# Patient Record
Sex: Female | Born: 1940 | Race: White | Hispanic: No | Marital: Married | Smoking: Never smoker
Health system: Southern US, Community
[De-identification: ages and names within clinical notes are randomized; demographics above are authoritative.]

## PROBLEM LIST (undated history)

## (undated) DIAGNOSIS — R4701 Aphasia: Secondary | ICD-10-CM

## (undated) DIAGNOSIS — R4789 Other speech disturbances: Secondary | ICD-10-CM

## (undated) DIAGNOSIS — Z860100 Personal history of colon polyps, unspecified: Secondary | ICD-10-CM

## (undated) DIAGNOSIS — N390 Urinary tract infection, site not specified: Secondary | ICD-10-CM

## (undated) DIAGNOSIS — F411 Generalized anxiety disorder: Secondary | ICD-10-CM

## (undated) HISTORY — DX: Personal history of colon polyps, unspecified: Z86.0100

## (undated) HISTORY — DX: Personal history of colonic polyps: Z86.010

## (undated) HISTORY — DX: Aphasia: R47.01

## (undated) HISTORY — DX: Urinary tract infection, site not specified: N39.0

## (undated) HISTORY — DX: Other speech disturbances: R47.89

## (undated) HISTORY — DX: Generalized anxiety disorder: F41.1

---

## 1998-02-19 ENCOUNTER — Other Ambulatory Visit: Admission: RE | Admit: 1998-02-19 | Discharge: 1998-02-19 | Payer: Self-pay | Admitting: Obstetrics and Gynecology

## 1999-07-28 ENCOUNTER — Other Ambulatory Visit: Admission: RE | Admit: 1999-07-28 | Discharge: 1999-07-28 | Payer: Self-pay | Admitting: Obstetrics and Gynecology

## 1999-08-09 ENCOUNTER — Encounter: Payer: Self-pay | Admitting: Obstetrics and Gynecology

## 1999-08-09 ENCOUNTER — Encounter: Admission: RE | Admit: 1999-08-09 | Discharge: 1999-08-09 | Payer: Self-pay | Admitting: Obstetrics and Gynecology

## 1999-08-26 ENCOUNTER — Ambulatory Visit (HOSPITAL_COMMUNITY): Admission: RE | Admit: 1999-08-26 | Discharge: 1999-08-26 | Payer: Self-pay | Admitting: General Surgery

## 2000-09-18 ENCOUNTER — Other Ambulatory Visit: Admission: RE | Admit: 2000-09-18 | Discharge: 2000-09-18 | Payer: Self-pay | Admitting: Obstetrics and Gynecology

## 2001-03-08 ENCOUNTER — Ambulatory Visit (HOSPITAL_COMMUNITY): Admission: RE | Admit: 2001-03-08 | Discharge: 2001-03-08 | Payer: Self-pay | Admitting: Gastroenterology

## 2001-12-11 ENCOUNTER — Other Ambulatory Visit: Admission: RE | Admit: 2001-12-11 | Discharge: 2001-12-11 | Payer: Self-pay | Admitting: Obstetrics and Gynecology

## 2002-11-18 ENCOUNTER — Encounter: Payer: Self-pay | Admitting: Gastroenterology

## 2002-11-18 ENCOUNTER — Encounter: Admission: RE | Admit: 2002-11-18 | Discharge: 2002-11-18 | Payer: Self-pay | Admitting: Gastroenterology

## 2003-04-29 ENCOUNTER — Encounter: Admission: RE | Admit: 2003-04-29 | Discharge: 2003-04-29 | Payer: Self-pay | Admitting: Obstetrics and Gynecology

## 2003-04-29 ENCOUNTER — Encounter: Payer: Self-pay | Admitting: Obstetrics and Gynecology

## 2004-01-26 ENCOUNTER — Other Ambulatory Visit: Admission: RE | Admit: 2004-01-26 | Discharge: 2004-01-26 | Payer: Self-pay | Admitting: Obstetrics and Gynecology

## 2004-05-03 ENCOUNTER — Encounter: Admission: RE | Admit: 2004-05-03 | Discharge: 2004-05-03 | Payer: Self-pay | Admitting: Obstetrics and Gynecology

## 2005-05-02 ENCOUNTER — Other Ambulatory Visit: Admission: RE | Admit: 2005-05-02 | Discharge: 2005-05-02 | Payer: Self-pay | Admitting: Obstetrics and Gynecology

## 2006-12-06 ENCOUNTER — Other Ambulatory Visit: Admission: RE | Admit: 2006-12-06 | Discharge: 2006-12-06 | Payer: Self-pay | Admitting: Obstetrics and Gynecology

## 2006-12-06 ENCOUNTER — Encounter (INDEPENDENT_AMBULATORY_CARE_PROVIDER_SITE_OTHER): Payer: Self-pay | Admitting: *Deleted

## 2006-12-21 ENCOUNTER — Ambulatory Visit: Payer: Self-pay | Admitting: Internal Medicine

## 2006-12-21 DIAGNOSIS — E7800 Pure hypercholesterolemia, unspecified: Secondary | ICD-10-CM | POA: Insufficient documentation

## 2006-12-21 DIAGNOSIS — E785 Hyperlipidemia, unspecified: Secondary | ICD-10-CM | POA: Insufficient documentation

## 2006-12-21 DIAGNOSIS — R609 Edema, unspecified: Secondary | ICD-10-CM | POA: Insufficient documentation

## 2006-12-21 HISTORY — DX: Pure hypercholesterolemia, unspecified: E78.00

## 2006-12-21 HISTORY — DX: Edema, unspecified: R60.9

## 2006-12-22 LAB — CONVERTED CEMR LAB
ALT: 13 units/L (ref 0–35)
AST: 16 units/L (ref 0–37)
Albumin: 4.7 g/dL (ref 3.5–5.2)
Alkaline Phosphatase: 78 units/L (ref 39–117)
BUN: 16 mg/dL (ref 6–23)
Bilirubin, Direct: <0.1 mg/dL (ref 0.0–0.3)
CO2: 24 meq/L (ref 19–32)
Calcium: 9.4 mg/dL (ref 8.4–10.5)
Chloride: 107 meq/L (ref 96–112)
Creatinine, Ser: 0.71 mg/dL (ref 0.40–1.20)
Glucose, Bld: 88 mg/dL (ref 70–99)
Potassium: 5.2 meq/L (ref 3.5–5.3)
Sodium: 143 meq/L (ref 135–145)
Total Bilirubin: 0.4 mg/dL (ref 0.3–1.2)
Total Protein: 8.1 g/dL (ref 6.0–8.3)

## 2006-12-26 ENCOUNTER — Encounter: Payer: Self-pay | Admitting: Internal Medicine

## 2007-01-06 ENCOUNTER — Encounter: Payer: Self-pay | Admitting: Internal Medicine

## 2008-03-31 ENCOUNTER — Other Ambulatory Visit: Admission: RE | Admit: 2008-03-31 | Discharge: 2008-03-31 | Payer: Self-pay | Admitting: Obstetrics and Gynecology

## 2010-08-07 ENCOUNTER — Encounter: Payer: Self-pay | Admitting: Obstetrics and Gynecology

## 2012-06-05 DIAGNOSIS — H01119 Allergic dermatitis of unspecified eye, unspecified eyelid: Secondary | ICD-10-CM | POA: Diagnosis not present

## 2013-01-24 DIAGNOSIS — L259 Unspecified contact dermatitis, unspecified cause: Secondary | ICD-10-CM | POA: Diagnosis not present

## 2013-01-24 DIAGNOSIS — L219 Seborrheic dermatitis, unspecified: Secondary | ICD-10-CM | POA: Diagnosis not present

## 2013-02-20 ENCOUNTER — Other Ambulatory Visit: Payer: Self-pay | Admitting: Neurosurgery

## 2013-02-20 DIAGNOSIS — M5416 Radiculopathy, lumbar region: Secondary | ICD-10-CM

## 2013-05-27 DIAGNOSIS — Z0000 Encounter for general adult medical examination without abnormal findings: Secondary | ICD-10-CM | POA: Diagnosis not present

## 2013-08-08 DIAGNOSIS — R509 Fever, unspecified: Secondary | ICD-10-CM | POA: Diagnosis not present

## 2013-08-08 DIAGNOSIS — J111 Influenza due to unidentified influenza virus with other respiratory manifestations: Secondary | ICD-10-CM | POA: Diagnosis not present

## 2013-08-15 DIAGNOSIS — R300 Dysuria: Secondary | ICD-10-CM | POA: Diagnosis not present

## 2013-08-15 DIAGNOSIS — N390 Urinary tract infection, site not specified: Secondary | ICD-10-CM | POA: Diagnosis not present

## 2013-08-22 ENCOUNTER — Other Ambulatory Visit: Payer: Self-pay | Admitting: Internal Medicine

## 2013-08-22 DIAGNOSIS — Z0000 Encounter for general adult medical examination without abnormal findings: Secondary | ICD-10-CM | POA: Diagnosis not present

## 2013-08-22 DIAGNOSIS — Z1231 Encounter for screening mammogram for malignant neoplasm of breast: Secondary | ICD-10-CM

## 2013-08-22 DIAGNOSIS — Z23 Encounter for immunization: Secondary | ICD-10-CM | POA: Diagnosis not present

## 2013-08-22 DIAGNOSIS — Z1331 Encounter for screening for depression: Secondary | ICD-10-CM | POA: Diagnosis not present

## 2013-09-05 ENCOUNTER — Ambulatory Visit
Admission: RE | Admit: 2013-09-05 | Discharge: 2013-09-05 | Disposition: A | Discharge status: Home / Self Care | Payer: Medicare Other | Source: Ambulatory Visit | Attending: Internal Medicine | Admitting: Internal Medicine

## 2013-09-05 DIAGNOSIS — Z1231 Encounter for screening mammogram for malignant neoplasm of breast: Secondary | ICD-10-CM | POA: Diagnosis not present

## 2013-10-03 DIAGNOSIS — D126 Benign neoplasm of colon, unspecified: Secondary | ICD-10-CM | POA: Diagnosis not present

## 2013-10-03 DIAGNOSIS — Z1211 Encounter for screening for malignant neoplasm of colon: Secondary | ICD-10-CM | POA: Diagnosis not present

## 2013-10-03 DIAGNOSIS — D128 Benign neoplasm of rectum: Secondary | ICD-10-CM | POA: Diagnosis not present

## 2013-10-03 DIAGNOSIS — K621 Rectal polyp: Secondary | ICD-10-CM | POA: Diagnosis not present

## 2013-10-03 DIAGNOSIS — K620 Anal polyp: Secondary | ICD-10-CM | POA: Diagnosis not present

## 2013-12-19 DIAGNOSIS — L570 Actinic keratosis: Secondary | ICD-10-CM | POA: Diagnosis not present

## 2013-12-19 DIAGNOSIS — L821 Other seborrheic keratosis: Secondary | ICD-10-CM | POA: Diagnosis not present

## 2014-01-07 DIAGNOSIS — H2589 Other age-related cataract: Secondary | ICD-10-CM | POA: Diagnosis not present

## 2014-01-27 DIAGNOSIS — L821 Other seborrheic keratosis: Secondary | ICD-10-CM | POA: Diagnosis not present

## 2014-01-27 DIAGNOSIS — L819 Disorder of pigmentation, unspecified: Secondary | ICD-10-CM | POA: Diagnosis not present

## 2014-01-27 DIAGNOSIS — D1801 Hemangioma of skin and subcutaneous tissue: Secondary | ICD-10-CM | POA: Diagnosis not present

## 2015-02-10 DIAGNOSIS — H43819 Vitreous degeneration, unspecified eye: Secondary | ICD-10-CM | POA: Diagnosis not present

## 2015-02-10 DIAGNOSIS — H25819 Combined forms of age-related cataract, unspecified eye: Secondary | ICD-10-CM | POA: Diagnosis not present

## 2015-02-10 DIAGNOSIS — H524 Presbyopia: Secondary | ICD-10-CM | POA: Diagnosis not present

## 2015-02-10 DIAGNOSIS — H5203 Hypermetropia, bilateral: Secondary | ICD-10-CM | POA: Diagnosis not present

## 2015-02-10 DIAGNOSIS — H04129 Dry eye syndrome of unspecified lacrimal gland: Secondary | ICD-10-CM | POA: Diagnosis not present

## 2015-02-10 DIAGNOSIS — H52221 Regular astigmatism, right eye: Secondary | ICD-10-CM | POA: Diagnosis not present

## 2015-04-14 DIAGNOSIS — N390 Urinary tract infection, site not specified: Secondary | ICD-10-CM | POA: Diagnosis not present

## 2017-07-04 ENCOUNTER — Other Ambulatory Visit: Payer: Self-pay | Admitting: Internal Medicine

## 2017-07-04 DIAGNOSIS — R4701 Aphasia: Secondary | ICD-10-CM

## 2017-07-12 ENCOUNTER — Ambulatory Visit
Admission: RE | Admit: 2017-07-12 | Discharge: 2017-07-12 | Disposition: A | Discharge status: Home / Self Care | Payer: Medicare Other | Source: Ambulatory Visit | Attending: Internal Medicine | Admitting: Internal Medicine

## 2017-07-12 DIAGNOSIS — R4701 Aphasia: Secondary | ICD-10-CM

## 2017-07-12 MED ORDER — GADOBENATE DIMEGLUMINE 529 MG/ML IV SOLN: 13.0000 mL | Freq: Once | INTRAVENOUS | Status: DC | PRN

## 2017-10-10 ENCOUNTER — Other Ambulatory Visit: Payer: Self-pay | Admitting: Internal Medicine

## 2017-10-10 DIAGNOSIS — Z1231 Encounter for screening mammogram for malignant neoplasm of breast: Secondary | ICD-10-CM

## 2017-10-30 ENCOUNTER — Encounter: Payer: Self-pay | Admitting: Radiology

## 2017-10-30 ENCOUNTER — Ambulatory Visit
Admission: RE | Admit: 2017-10-30 | Discharge: 2017-10-30 | Disposition: A | Discharge status: Home / Self Care | Payer: Medicare Other | Source: Ambulatory Visit | Attending: Internal Medicine | Admitting: Internal Medicine

## 2017-10-30 DIAGNOSIS — Z1231 Encounter for screening mammogram for malignant neoplasm of breast: Secondary | ICD-10-CM

## 2019-03-26 ENCOUNTER — Encounter: Payer: Self-pay | Admitting: Neurology

## 2019-04-22 NOTE — Progress Notes (Signed)
NEUROLOGY CONSULTATION NOTE  TYCEE BLAMER MRN: [id number] DOB: Jan 12, 1941  Referring provider: Lavone Orn, MD Primary care provider: Lavone Orn, MD  Reason for consult:  Speech abnormality  HISTORY OF PRESENT ILLNESS: Courtney Rangel is a 78 year old right-handed white female who presents with worsening speech abnormality.  History supplemented by referring provider note.  In 2018, she developed an episode of severe right sided facial pain radiating up the jaw to the temple lasting up to a few minutes.  No other associated focal symptoms.  Afterwards, she noticed speech difficulty.  It is not word-finding difficulty.  She knows what she wants to say but has trouble getting the words out.   She reports that it has gradually gotten worse.  She also has a dull persistent headache in her right temple.  No visual disturbance, facial droop, facial numbness. Slurred speech or unilateral numbness or weakness.  She reports no difficulty reading or understanding other people when they speak to her.  She sometimes notes difficulty writing because her hand may shake.  She reports some short-term memory difficulty such as misplacing items but nothing significant that affects her independence and daily living.  She reports history of anxiety.  MRI of brain from 07/12/2017 was personally reviewed and demonstrated scattered periventricular and subcortical T2 hyperintensities in the cerebral white matter bilaterally, consistent with chronic small vessel ischemic changes, but no acute intracranial abnormality.  Labs, such as sed rate, CRP, CBC, CMP and TSH, were unremarkable.    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  She has no difficulty with her job except she has to concentrate to talk with people.  She uses a Art therapist.  She reports history of migraines in young adulthood but not for many  years.    PAST MEDICAL HISTORY: History reviewed. No pertinent past medical history.  PAST SURGICAL HISTORY: History reviewed. No pertinent surgical history.  MEDICATIONS: Outpatient Encounter Medications as of 04/24/2019  Medication Sig   escitalopram (LEXAPRO) 10 MG tablet 1/2 TABLET ONCE A DAY FOR 3 DAYS AND THEN ONE TABLET ONCE A DAY ONCE A DAY ORALLY 30 DAY(S)   LORazepam (ATIVAN) 0.5 MG tablet 1 TABLETE TWICE A DAY AS NEEDED ORALLY   No facility-administered encounter medications on file as of 04/24/2019.     ALLERGIES: No Known Allergies  FAMILY HISTORY: Family History  Problem Relation Age of Onset   Heart attack Mother    Heart attack Brother     SOCIAL HISTORY: Social History   Socioeconomic History   Marital status: Married    Spouse name: Not on file   Number of children: Not on file   Years of education: Not on file   Highest education level: Not on file  Occupational History   Not on file  Social Needs   Financial resource strain: Not on file   Food insecurity    Worry: Not on file    Inability: Not on file   Transportation needs    Medical: Not on file    Non-medical: Not on file  Tobacco Use   Smoking status: Never Smoker   Smokeless tobacco: Never Used  Substance and Sexual Activity   Alcohol use: Not on file   Drug use: Not on file   Sexual activity: Not on file  Lifestyle   Physical activity    Days per week: Not on file  Minutes per session: Not on file   Stress: Not on file  Relationships   Social connections    Talks on phone: Not on file    Gets together: Not on file    Attends religious service: Not on file    Active member of club or organization: Not on file    Attends meetings of clubs or organizations: Not on file    Relationship status: Not on file   Intimate partner violence    Fear of current or ex partner: Not on file    Emotionally abused: Not on file    Physically abused: Not on file    Forced  sexual activity: Not on file  Other Topics Concern   Not on file  Social History Narrative   One level with husband   Right handed   Education - high school   Caffeine - coffee 3 cups/day   Exercise - pickleball     REVIEW OF SYSTEMS: Constitutional: No fevers, chills, or sweats, no generalized fatigue, change in appetite Eyes: No visual changes, double vision, eye pain Ear, nose and throat: No hearing loss, ear pain, nasal congestion, sore throat Cardiovascular: No chest pain, palpitations Respiratory:  No shortness of breath at rest or with exertion, wheezes GastrointestinaI: No nausea, vomiting, diarrhea, abdominal pain, fecal incontinence Genitourinary:  No dysuria, urinary retention or frequency Musculoskeletal:  No neck pain, back pain Integumentary: No rash, pruritus, skin lesions Neurological: as above Psychiatric: No depression, insomnia, anxiety Endocrine: No palpitations, fatigue, diaphoresis, mood swings, change in appetite, change in weight, increased thirst Hematologic/Lymphatic:  No purpura, petechiae. Allergic/Immunologic: no itchy/runny eyes, nasal congestion, recent allergic reactions, rashes  PHYSICAL EXAM: Blood pressure (!) 141/56, pulse 76, temperature 97.9 F (36.6 C), height 5\' 4"  (1.626 m), weight 158 lb (71.7 kg), SpO2 99 %. General: No acute distress.  Patient appears well-groomed.   Head:  Normocephalic/atraumatic Eyes:  fundi examined but not visualized Neck: supple, no paraspinal tenderness, full range of motion Back: No paraspinal tenderness Heart: regular rate and rhythm Lungs: Clear to auscultation bilaterally. Vascular: No carotid bruits. Neurological Exam: Mental status: alert and oriented to person, place, and time, delayed recall poor, remote memory intact, fund of knowledge intact, attention and concentration impaired, speech dysfluent but not dysarthric, Difficulty with naming.  Able to read and repeat.  Follows commands.  Difficulty with  Trail Making Test, copying a cube and drawing a clock. Montreal Cognitive Assessment  04/24/2019  Visuospatial/ Executive (0/5) 1  Naming (0/3) 1  Attention: Read list of digits (0/2) 1  Attention: Read list of letters (0/1) 1  Attention: Serial 7 subtraction starting at 100 (0/3) 0  Language: Repeat phrase (0/2) 2  Language : Fluency (0/1) 0  Abstraction (0/2) 1  Delayed Recall (0/5) 1  Orientation (0/6) 5  Total 13  Adjusted Score (based on education) 14   Cranial nerves: CN I: not tested CN II: pupils equal, round and reactive to light, visual fields intact CN III, IV, VI:  full range of motion, no nystagmus, no ptosis CN V: facial sensation intact CN VII: upper and lower face symmetric CN VIII: hearing intact CN IX, X: gag intact, uvula midline CN XI: sternocleidomastoid and trapezius muscles intact CN XII: tongue midline Bulk & Tone: normal, no fasciculations. Motor:  5/5 throughout  Sensation:  temperature and vibration sensation intact. Deep Tendon Reflexes:  2+ throughout, toes downgoing.   Finger to nose testing:  Without dysmetria.   Heel to shin:  Without  dysmetria.   Gait:  Normal station and stride.  Able to turn and tandem walk. Romberg negative.  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  In addition to language, she also demonstrates cognitive deficits in visuospatial/executive functioning and delayed recall.  She demonstrates reduced attention and concentration, which may be affected by underlying anxiety.  PLAN: 1.  I would like to repeat MRI of brain without contrast 2.  I would like to send her for neuropsychological testing 3.  Follow up afterwards.  Thank you for allowing me to take part in the care of this patient.  Metta Clines, DO  CC: Lavone Orn, MD

## 2019-04-24 ENCOUNTER — Encounter: Payer: Self-pay | Admitting: Neurology

## 2019-04-24 ENCOUNTER — Ambulatory Visit: Payer: Medicare Other | Admitting: Neurology

## 2019-04-24 ENCOUNTER — Other Ambulatory Visit: Payer: Self-pay

## 2019-04-24 VITALS — BP 141/56 | HR 76 | Temp 97.9°F | Ht 64.0 in | Wt 158.0 lb

## 2019-04-24 DIAGNOSIS — R4701 Aphasia: Secondary | ICD-10-CM | POA: Diagnosis not present

## 2019-04-24 NOTE — Patient Instructions (Signed)
You seem to have a language dysfunction, which I want to evaluate further: 1.  We will check MRI of brain without contrast 2.  I will refer you to see Dr. Melvyn Novas for neurocognitive testing 3.  Follow up with me afterwards.

## 2019-05-19 ENCOUNTER — Encounter: Payer: Medicare Other | Admitting: Psychology

## 2019-05-21 ENCOUNTER — Other Ambulatory Visit: Payer: Self-pay

## 2019-05-21 ENCOUNTER — Ambulatory Visit
Admission: RE | Admit: 2019-05-21 | Discharge: 2019-05-21 | Disposition: A | Discharge status: Home / Self Care | Payer: Medicare Other | Source: Ambulatory Visit | Attending: Neurology | Admitting: Neurology

## 2019-05-21 DIAGNOSIS — R4701 Aphasia: Secondary | ICD-10-CM

## 2019-05-22 ENCOUNTER — Telehealth: Payer: Self-pay

## 2019-05-22 NOTE — Telephone Encounter (Signed)
-----   Message from Pieter Partridge, DO sent at 05/22/2019  6:03 AM EST ----- MRI of brain is unremarkable.

## 2019-05-22 NOTE — Telephone Encounter (Signed)
Called spoke with patient she was informed of results. And understands

## 2019-05-28 ENCOUNTER — Encounter: Payer: Medicare Other | Admitting: Psychology

## 2019-06-11 ENCOUNTER — Other Ambulatory Visit: Payer: Self-pay

## 2019-06-11 ENCOUNTER — Ambulatory Visit: Payer: Medicare Other | Admitting: Cardiology

## 2019-06-11 ENCOUNTER — Encounter: Payer: Self-pay | Admitting: Cardiology

## 2019-06-11 VITALS — BP 142/82 | HR 74 | Ht 64.0 in | Wt 152.4 lb

## 2019-06-11 DIAGNOSIS — Z7189 Other specified counseling: Secondary | ICD-10-CM

## 2019-06-11 DIAGNOSIS — E7800 Pure hypercholesterolemia, unspecified: Secondary | ICD-10-CM | POA: Diagnosis not present

## 2019-06-11 DIAGNOSIS — R030 Elevated blood-pressure reading, without diagnosis of hypertension: Secondary | ICD-10-CM | POA: Diagnosis not present

## 2019-06-11 DIAGNOSIS — Z8249 Family history of ischemic heart disease and other diseases of the circulatory system: Secondary | ICD-10-CM | POA: Diagnosis not present

## 2019-06-11 NOTE — Patient Instructions (Signed)
Medication Instructions:  Your Physician recommend you continue on your current medication as directed.    *If you need a refill on your cardiac medications before your next appointment, please call your pharmacy*  Lab Work: None  Testing/Procedures: None  Follow-Up: At [facility], you and your health needs are our priority.  As part of our continuing mission to provide you with exceptional heart care, we have created designated Provider Care Teams.  These Care Teams include your primary Cardiologist (physician) and Advanced Practice Providers (APPs -  Physician Assistants and Nurse Practitioners) who all work together to provide you with the care you need, when you need it.  Your next appointment:   2 month(s)  The format for your next appointment:   Virtual Visit   Provider:   Buford Dresser, MD

## 2019-06-11 NOTE — Progress Notes (Signed)
Cardiology Office Note:    Date:  06/11/2019   ID:  Courtney Rangel, DOB 13-May-1941, MRN [id number]  PCP:  Lavone Orn, MD  Cardiologist:  Buford Dresser, MD  Referring MD: Lavone Orn, MD   CC: new patient visit to discuss CV risk.  History of Present Illness:    Courtney Rangel is a 78 y.o. female without prior cardiac history who is seen as a new consult at the request of Lavone Orn, MD for the evaluation and management of cardiovascular risk. She is the mother of Cecilio Asper in our practice.  Brings 05/22/19 lipid panel results. I unfortunately do not have other records. She endorses that she is here to discuss prevention/risk factors.  Tchol 233, HDL 57, TG 166, LDL 143, NHDL 176  Cardiovascular risk factors: Prior clinical ASCVD: none Comorbid conditions: denies hypertension, diabetes, chronic kidney disease: Just recently told she has high cholesterol Metabolic syndrome/Obesity: none, at her highest adult weight right now Chronic inflammatory conditions: none Tobacco use history: never Family history: mother had heart conditions, died age 74. Older brother died age 25 of massive MI. Father has had an MI but not severe. Mat gma had stroke and passed away. Another brother died recently of cancer. Prior cardiac testing and/or incidental findings on other testing (ie coronary calcium): none Exercise level: no limitations right now, does still work full time, walks all day at work. No intentional activity. Is going to work on increasing activity and see if she is limited (if so, she will call me). Current diet: has been eating more poorly than usual with the pandemic, trying to improve.   HTN: denies history. Feels stressed today. Thinks it has been normal on prior checks. Has a BP cuff at home, will start checking. Instructed on how to use. If SBP >140 she will call me.  Denies chest pain, shortness of breath at rest or with normal exertion. No PND, orthopnea, LE edema  or unexpected weight gain. No syncope or palpitations.  ROS: long history of restless leg/toe cramping, not exertional. 1-2 years ago, had sharp pain go up her back. Has this pain rarely, no clear triggers, no associated symptoms.   No past medical history on file.  No past surgical history on file.  Current Medications: Current Outpatient Medications on File Prior to Visit  Medication Sig  . atorvastatin (LIPITOR) 10 MG tablet Take 10 mg by mouth daily.  Marland Kitchen escitalopram (LEXAPRO) 10 MG tablet Take 10 mg by mouth daily.    No current facility-administered medications on file prior to visit.      Allergies:   Patient has no known allergies.   Social History   Tobacco Use  . Smoking status: Never Smoker  . Smokeless tobacco: Never Used  Substance Use Topics  . Alcohol use: Not on file  . Drug use: Not on file    Family History: family history includes Heart attack in her brother and mother.  ROS:   Please see the history of present illness.  Additional pertinent ROS: Constitutional: Negative for chills, fever, unintentional weight loss. Has started to feel sweats HENT: Negative for ear pain and hearing loss.   Eyes: Negative for loss of vision and eye pain.  Respiratory: Negative for cough, sputum, wheezing.   Cardiovascular: See HPI. Gastrointestinal: Negative for abdominal pain, and hematochezia. Notes occasional dark stools, not recent Genitourinary: Negative for dysuria and hematuria.  Musculoskeletal: Negative for falls and myalgias.  Skin: Negative for itching and rash.  Neurological:  Negative for focal weakness, focal sensory changes and loss of consciousness. Is concerned about memory loss. Endo/Heme/Allergies: Does not bruise/bleed easily.     EKGs/Labs/Other Studies Reviewed:    The following studies were reviewed today: None  EKG:  EKG is personally reviewed.  The ekg ordered today demonstrates NSR  Recent Labs: No results found for requested labs within  last 8760 hours.  Recent Lipid Panel No results found for: CHOL, TRIG, HDL, CHOLHDL, VLDL, LDLCALC, LDLDIRECT  Physical Exam:    VS:  BP (!) 142/82   Pulse 74   Ht 5\' 4"  (1.626 m)   Wt 152 lb 6.4 oz (69.1 kg)   SpO2 97%   BMI 26.16 kg/m     Wt Readings from Last 3 Encounters:  06/11/19 152 lb 6.4 oz (69.1 kg)  04/24/19 158 lb (71.7 kg)    GEN: Well nourished, well developed in no acute distress HEENT: Normal, moist mucous membranes NECK: No JVD CARDIAC: regular rhythm, normal S1 and S2, no rubs or gallops. No murmurs. VASCULAR: Radial and DP pulses 2+ bilaterally. No carotid bruits RESPIRATORY:  Clear to auscultation without rales, wheezing or rhonchi  ABDOMEN: Soft, non-tender, non-distended MUSCULOSKELETAL:  Ambulates independently SKIN: Warm and dry, no edema NEUROLOGIC:  Alert and oriented x 3. No focal neuro deficits noted. PSYCHIATRIC:  Normal affect    ASSESSMENT:    1. Hypercholesteremia   2. Cardiac risk counseling   3. Family history of heart disease   4. Counseling on health promotion and disease prevention   5. Elevated blood pressure reading    PLAN:    Cardiac risk counseling and prevention recommendations: We spent significant time on this today. She is understandably concerned given her family history. However, she is very healthy for her age and has not had any known cardiac issues.  We discussed risk factors at length. We did the online ASCVD calculator together, went through the components and how they affect the score.  We discussed pathology of cholesterol, how plaques form, that MI/CVA result commonly from acute plaque rupture and not gradual stenosis. Discussed mechanism of statin to both decrease plaque accumulation and stabilize plaque that is already present. Discussed role of aspirin, why it is no longer a first line recommendation.  Discussed lifestyle:  -recommend heart healthy/Mediterranean diet, with whole grains, fruits, vegetable, fish,  lean meats, nuts, and olive oil. Limit salt. -recommend moderate walking, 3-5 times/week for 30-50 minutes each session. Aim for at least 150 minutes.week. Goal should be pace of 3 miles/hours, or walking 1.5 miles in 30 minutes -recommend avoidance of tobacco products. Avoid excess alcohol. -ASCVD risk score: 23% (optimum, 15%)  She was recently started on atorvastatin for her hypercholesterolemia. Tolerating. Reviewed side effects (and discussed how they compare to placebo).  Her blood pressure is slightly elevated today, but she is nervous. She will check at home and let me know if it is consistently 0000000 systolic.  Plan for follow up: 2 mos, we will check on lipids and blood pressure at that time.  Medication Adjustments/Labs and Tests Ordered: Current medicines are reviewed at length with the patient today.  Concerns regarding medicines are outlined above.  Orders Placed This Encounter  Procedures  . EKG 12-Lead   No orders of the defined types were placed in this encounter.   Patient Instructions  Medication Instructions:  Your Physician recommend you continue on your current medication as directed.    *If you need a refill on your cardiac medications before your  next appointment, please call your pharmacy*  Lab Work: None  Testing/Procedures: None  Follow-Up: At [facility], you and your health needs are our priority.  As part of our continuing mission to provide you with exceptional heart care, we have created designated Provider Care Teams.  These Care Teams include your primary Cardiologist (physician) and Advanced Practice Providers (APPs -  Physician Assistants and Nurse Practitioners) who all work together to provide you with the care you need, when you need it.  Your next appointment:   2 month(s)  The format for your next appointment:   Virtual Visit   Provider:   Buford Dresser, MD     Signed, Buford Dresser, MD PhD 06/11/2019 7:15 PM     [place]

## 2019-06-17 ENCOUNTER — Encounter: Payer: Medicare Other | Admitting: Psychology

## 2019-06-30 ENCOUNTER — Encounter: Payer: Medicare Other | Admitting: Psychology

## 2019-07-25 ENCOUNTER — Encounter: Payer: Medicare Other | Admitting: Psychology

## 2019-08-13 ENCOUNTER — Telehealth (INDEPENDENT_AMBULATORY_CARE_PROVIDER_SITE_OTHER): Payer: Medicare Other | Admitting: Cardiology

## 2019-08-13 ENCOUNTER — Encounter: Payer: Self-pay | Admitting: Cardiology

## 2019-08-13 VITALS — Ht 60.0 in | Wt 140.0 lb

## 2019-08-13 DIAGNOSIS — Z8249 Family history of ischemic heart disease and other diseases of the circulatory system: Secondary | ICD-10-CM | POA: Diagnosis not present

## 2019-08-13 DIAGNOSIS — Z7189 Other specified counseling: Secondary | ICD-10-CM | POA: Diagnosis not present

## 2019-08-13 DIAGNOSIS — E785 Hyperlipidemia, unspecified: Secondary | ICD-10-CM | POA: Diagnosis not present

## 2019-08-13 NOTE — Progress Notes (Signed)
Virtual Visit via Telephone Note   This visit type was conducted due to national recommendations for restrictions regarding the COVID-19 Pandemic (e.g. social distancing) in an effort to limit this patient's exposure and mitigate transmission in our community.  Due to her co-morbid illnesses, this patient is at least at moderate risk for complications without adequate follow up.  This format is felt to be most appropriate for this patient at this time.  The patient did not have access to video technology/had technical difficulties with video requiring transitioning to audio format only (telephone).  All issues noted in this document were discussed and addressed.  No physical exam could be performed with this format.  Please refer to the patient's chart for her  consent to telehealth for [facility].   Date:  08/13/2019   ID:  Courtney Rangel, DOB 04/17/1941, MRN [id number]  Patient Location: Home Provider Location: Home  PCP:  Lavone Orn, MD  Cardiologist:  Buford Dresser, MD  Electrophysiologist:  None   Evaluation Performed:  Follow-Up Visit  Chief Complaint:  Follow up  History of Present Illness:    Courtney Rangel is a 79 y.o. female with hyperlipidemia who is followed up for cardiovascular risk. She is the mother of Cecilio Asper in our practice.  The patient does not have symptoms concerning for COVID-19 infection (fever, chills, cough, or new shortness of breath).   Doing very well. Tolerating atorvastatin, had recheck blood work and was told it was fine. I cannot see labs today. I will attempt to get these from Dr. Delene Ruffini office. She feels much more comfortable about her cardiovascular risk after our first meeting.   She is doing well, following safe practices re: the pandemic. Still working. Got first dose of Covid vaccine, scheduled to get second dose 2/9.  Denies chest pain, shortness of breath at rest or with normal exertion. No PND, orthopnea, LE edema or  unexpected weight gain. No syncope or palpitations.  Current Meds  Medication Sig  . atorvastatin (LIPITOR) 10 MG tablet Take 10 mg by mouth daily.  . Cyanocobalamin (B-12 PO) Take 5,000 mg by mouth daily.  Marland Kitchen escitalopram (LEXAPRO) 10 MG tablet Take 10 mg by mouth daily.      Allergies:   Patient has no known allergies.   Social History   Tobacco Use  . Smoking status: Never Smoker  . Smokeless tobacco: Never Used  Substance Use Topics  . Alcohol use: Not on file  . Drug use: Not on file     Family Hx: The patient's family history includes Heart attack in her brother and mother.  ROS:   Please see the history of present illness.    All other systems reviewed and are negative.   Prior CV studies:   The following studies were reviewed today: No CV studies  Labs/Other Tests and Data Reviewed:    EKG:  An ECG dated 06/11/19 was personally reviewed today and demonstrated:  NSR  Recent Labs: No results found for requested labs within last 8760 hours.   Recent Lipid Panel No results found for: CHOL, TRIG, HDL, CHOLHDL, LDLCALC, LDLDIRECT  Wt Readings from Last 3 Encounters:  08/13/19 140 lb (63.5 kg)  06/11/19 152 lb 6.4 oz (69.1 kg)  04/24/19 158 lb (71.7 kg)     Objective:    Vital Signs:  Ht 5' (1.524 m)   Wt 140 lb (63.5 kg)   BMI 27.34 kg/m    Speaking comfortably on the phone, no  audible wheezing In no acute distress Alert and oriented Normal affect Normal speech  ASSESSMENT & PLAN:    CV risk counseling and prevention: -recommend heart healthy/Mediterranean diet, with whole grains, fruits, vegetable, fish, lean meats, nuts, and olive oil. Limit salt. -recommend moderate walking, 3-5 times/week for 30-50 minutes each session. Aim for at least 150 minutes.week. Goal should be pace of 3 miles/hours, or walking 1.5 miles in 30 minutes -recommend avoidance of tobacco products. Avoid excess alcohol.  Hyperlipidemia: doing well on statin. Reports that she  had lab recheck, but I cannot see. I could not retrieve through [facility], will attempt to get through Dr. Delene Ruffini office.  COVID-19 Education: The signs and symptoms of COVID-19 were discussed with the patient and how to seek care for testing (follow up with PCP or arrange E-visit).  The importance of social distancing was discussed today.  Time:   Today, I have spent 7 minutes via phone with the patient with telehealth technology discussing the above problems.  Total time including chart review (including attempt to get labs through [facility]) and documentation 15 minutes  Patient Instructions  Medication Instructions:  Your physician recommends that you continue on your current medications as directed. Please refer to the Current Medication list given to you today.  *If you need a refill on your cardiac medications before your next appointment, please call your pharmacy*  Lab Work: NONE  Testing/Procedures: NONE  Follow-Up: At [facility], you and your health needs are our priority.  As part of our continuing mission to provide you with exceptional heart care, we have created designated Provider Care Teams.  These Care Teams include your primary Cardiologist (physician) and Advanced Practice Providers (APPs -  Physician Assistants and Nurse Practitioners) who all work together to provide you with the care you need, when you need it.  Your next appointment:   12 month(s) or sooner as if needed. You will receive a reminder letter in the mail two months in advance. If you don't receive a letter, please call our office to schedule the follow-up appointment.  The format for your next appointment:   Either In Person or Virtual  Provider:   Buford Dresser, MD      Signed, Buford Dresser, MD  08/13/2019 9:13 PM    [place]

## 2019-08-13 NOTE — Patient Instructions (Signed)
Medication Instructions:  Your physician recommends that you continue on your current medications as directed. Please refer to the Current Medication list given to you today.  *If you need a refill on your cardiac medications before your next appointment, please call your pharmacy*  Lab Work: NONE  Testing/Procedures: NONE  Follow-Up: At [facility], you and your health needs are our priority.  As part of our continuing mission to provide you with exceptional heart care, we have created designated Provider Care Teams.  These Care Teams include your primary Cardiologist (physician) and Advanced Practice Providers (APPs -  Physician Assistants and Nurse Practitioners) who all work together to provide you with the care you need, when you need it.  Your next appointment:   12 month(s) or sooner as if needed. You will receive a reminder letter in the mail two months in advance. If you don't receive a letter, please call our office to schedule the follow-up appointment.  The format for your next appointment:   Either In Person or Virtual  Provider:   Buford Dresser, MD

## 2019-08-22 ENCOUNTER — Ambulatory Visit: Payer: Medicare Other | Admitting: Neurology

## 2020-09-30 ENCOUNTER — Telehealth: Payer: Self-pay

## 2020-09-30 NOTE — Telephone Encounter (Signed)
Patient requested that we no longer call her for follow up visits, she feels that there is "no need to be seen by the doctor anymore". VB 09-30-2020

## 2020-11-02 ENCOUNTER — Other Ambulatory Visit: Payer: Self-pay | Admitting: Internal Medicine

## 2020-11-02 ENCOUNTER — Ambulatory Visit
Admission: RE | Admit: 2020-11-02 | Discharge: 2020-11-02 | Disposition: A | Discharge status: Home / Self Care | Payer: Medicare Other | Source: Ambulatory Visit | Attending: Internal Medicine | Admitting: Internal Medicine

## 2020-11-02 DIAGNOSIS — M25572 Pain in left ankle and joints of left foot: Secondary | ICD-10-CM

## 2021-01-06 ENCOUNTER — Encounter: Payer: Self-pay | Admitting: Psychology

## 2021-03-17 ENCOUNTER — Ambulatory Visit (INDEPENDENT_AMBULATORY_CARE_PROVIDER_SITE_OTHER): Payer: Medicare Other | Admitting: Psychology

## 2021-03-17 ENCOUNTER — Other Ambulatory Visit: Payer: Self-pay

## 2021-03-17 ENCOUNTER — Ambulatory Visit: Payer: Medicare Other | Admitting: Psychology

## 2021-03-17 ENCOUNTER — Encounter: Payer: Self-pay | Admitting: Psychology

## 2021-03-17 DIAGNOSIS — R4789 Other speech disturbances: Secondary | ICD-10-CM | POA: Insufficient documentation

## 2021-03-17 DIAGNOSIS — F0280 Dementia in other diseases classified elsewhere without behavioral disturbance: Secondary | ICD-10-CM

## 2021-03-17 DIAGNOSIS — G309 Alzheimer's disease, unspecified: Secondary | ICD-10-CM

## 2021-03-17 DIAGNOSIS — Z86010 Personal history of colonic polyps: Secondary | ICD-10-CM

## 2021-03-17 DIAGNOSIS — F411 Generalized anxiety disorder: Secondary | ICD-10-CM

## 2021-03-17 DIAGNOSIS — R4701 Aphasia: Secondary | ICD-10-CM | POA: Insufficient documentation

## 2021-03-17 DIAGNOSIS — N390 Urinary tract infection, site not specified: Secondary | ICD-10-CM | POA: Insufficient documentation

## 2021-03-17 DIAGNOSIS — R4189 Other symptoms and signs involving cognitive functions and awareness: Secondary | ICD-10-CM

## 2021-03-17 HISTORY — DX: Alzheimer's disease, unspecified: F02.80

## 2021-03-17 NOTE — Progress Notes (Signed)
NEUROPSYCHOLOGICAL EVALUATION [place]. [facility] Department of Neurology  Date of Evaluation: March 17, 2021  Reason for Referral:   Courtney Rangel is a 80 y.o. right-handed Caucasian female referred by  Lavone Orn, M.D. , to characterize her current cognitive functioning and assist with diagnostic clarity and treatment planning in the context of subjective cognitive decline and expressive language concerns.   Assessment and Plan:   Clinical Impression(s): Courtney Rangel pattern of performance is suggestive of profound impairment surrounding all aspects of learning and memory. There was also notable impairment exhibited across executive functioning, receptive language, expressive language, and visuospatial abilities. Performance variability was exhibited across processing speed and complex attention. Basic attention represented her lone strength. However, she also did perform adequately on a task assessing safety/judgment. Courtney Rangel denied all cognitive concerns or difficulties completing instrumental activities of daily living (ADLs) independently. However, I do not feel that she has accurate insight into the extent of current limitations and she did add that she is retiring over the next few weeks due to memory dysfunction. 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 However, she may be towards the milder end of this spectrum as her husband also did not report pronounced functional decline.   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 Across memory tasks, Courtney Rangel had trouble learning information and did not benefit from repeated learning trials. After a brief delay, she was fully amnestic across all memory tasks and made comments towards the psychometrist to suggest that she did not recall ever  being presented this information 15 minutes earlier. Finally, she performed very poorly across yes/no recognition trials. Taken together, this suggests a severe impairment surrounding memory storage, which is the hallmark characteristic of this disease process. Additional impairments surrounding confrontation naming, semantic fluency, visuospatial abilities, and executive functioning are also unfortunately quite consistent with expected patterns of disease progression. 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 While this condition often shares Alzheimer's disease pathology, her memory performances are more consistent with Alzheimer's disease as being the primary disease process. Behavioral characteristics are not consistent with Lewy body dementia or frontotemporal dementia. Prior neuroimaging in 2020 did reveal mildly advanced small vessel ischemic changes and there could be a small vascular contribution to her current presentation. Continued medical monitoring will be important moving forward.   Recommendations: It appears that Courtney Rangel was last seen by a neurologist Metta Clines, D.O.) back in 2020. I would recommend that she re-establish care with neurology and discuss memory-based medication intervention. It is important to highlight that these medications have been shown to slow functional decline in some individuals. No current treatment is able to stop or reverse cognitive decline in the face of a degenerative brain disease.   While performance across neurocognitive testing is not a strong predictor of an individual's safety operating a motor vehicle, I would recommend that she fully abstain from driving pending the completion of a formal driving evaluation due to significant and somewhat diffuse cognitive impairment. Should she or her husband/family wish to pursue a formalized driving evaluation, they  would be encouraged to contact [employer] in [place], [place] at [phone]. Another option would be through [facility]; however, the latter would likely require a referral from a medical doctor. [facility] can be  reached directly at [phone].   Should there be further progression of deficits over time, Courtney Rangel is unlikely to regain any independent living skills lost. Therefore, it is recommended that she remain as involved as possible in all aspects of household chores, finances, and medication management, with supervision to ensure adequate performance. She will likely benefit from the establishment and maintenance of a routine in order to maximize her functional abilities over time.  It will be important for Courtney Rangel to have another person with her when in situations where she may need to process information, weigh the pros and cons of different options, and make decisions, in order to ensure that she fully understands and recalls all information to be considered.  If not already done, Courtney Rangel and her family may want to discuss her wishes regarding durable power of attorney and medical decision making, so that she can have input into these choices. Additionally, they may wish to discuss future plans for caretaking and seek out community options for in home/residential care should they become necessary.  Courtney Rangel is encouraged to attend to lifestyle factors for brain health (e.g., regular physical exercise, good nutrition habits, regular participation in cognitively-stimulating activities, and general stress management techniques), which are likely to have benefits for both emotional adjustment and cognition. Optimal control of vascular risk factors (including safe cardiovascular exercise and adherence to dietary recommendations) is encouraged. Continued participation in activities which provide mental stimulation and social interaction is also recommended.    Important information to remember should be provided in written formats. This should be placed in a highly visible and commonly frequented area of her residence to try and help promote recall.  Review of Records:   Ms. Tanks was seen by [facility] Metta Clines, D.O.) most recently on 04/24/2019 for an evaluation of a worsening speech abnormality. Briefly, In 2018, she developed an episode of severe right sided facial pain radiating up the jaw to the temple lasting up to a few minutes. No other associated focal symptoms were reported. Afterwards, she noticed speech difficulty. Specifically, she reported knowing what she wants to say but has trouble getting the words out. She reported that this has gradually worsened over time. She also reported a dull persistent headache in her right temple. She denied visual disturbance, facial droop, facial numbness, slurred speech, or unilateral numbness or weakness. She reported no difficulty reading or understanding other people when they speak to her. Outside of expressive language, she also reported some short-term memory difficulty such as misplacing items. The latter was not said to affect her independence or daily living. Performance on a brief cognitive screening instrument (MOCA) was 14/30. It appears that she was referred for neuropsychological testing but I do not have record of her ever following through with this referral.   Most recently, Ms. Rougeau was seen by [facility] Lavone Orn, M.D.) on 01/05/2021 for follow-up. Dr. Laurann Montana noted a history of unspecified cognitive impairment dating back about 1.5 years prior. At that time, her husband reported that symptoms had been worsening, particularly concerns surrounding short-term memory. An example included Ms. Glick being told something and forgetting about the conversation by the next day. She did report that she had been forgetting the names of clients while at work. Ultimately, Ms.  Stacy was referred for a comprehensive neuropsychological evaluation to characterize her cognitive abilities and to assist with diagnostic clarity and treatment planning.   Brain MRI on 07/12/2017 revealed mildly advanced chronic microvascular ischemia but no  evidence for stroke or other intracranial abnormality. Brain MRI on 05/21/2019 was stable relative to her previous scan.   Past Medical History:  Diagnosis Date   Ankle edema 12/21/2006   Expressive aphasia    Generalized anxiety disorder    History of colon polyps    Hypercholesterolemia 12/21/2006   UTI (urinary tract infection)     No past surgical history on file.   Current Outpatient Medications:    atorvastatin (LIPITOR) 10 MG tablet, Take 10 mg by mouth daily., Disp: , Rfl:    Cyanocobalamin (B-12 PO), Take 5,000 mg by mouth daily., Disp: , Rfl:    escitalopram (LEXAPRO) 10 MG tablet, Take 10 mg by mouth daily. , Disp: , Rfl:   Clinical Interview:   The following information was obtained during a clinical interview with Ms. Maqueda and her husband prior to cognitive testing.  Cognitive Symptoms: Decreased short-term memory: Denied. Ms. Caputo denied striking short-term memory concerns during interview. She did acknowledge occasionally misplacing things, but minimized the severity of this issue. Her husband expressed far greater concerns, noting that she will often forget conversations which were had the previous day, as well as upcoming appointments or events. He also noted that she has trouble remembering the names of familiar individuals and will often ask him "who was that?" He noted that memory concerns have been present for at least the past year but have seemed far worse during the past 3-4 months.  Decreased long-term memory: Denied. Decreased attention/concentration: Denied. Reduced processing speed: Denied. She endorsed "maybe" having slowed processing speed but emphasized that if she does, she is "not bothered by  it."  Difficulties with executive functions: Denied. Difficulties with emotion regulation: Denied. However, her husband did report her seeming more irritable, agitated, and having a shorter fuse, particularly while interacting with him. He said that he seems to "set her off" more often than usual.  Difficulties with receptive language: Denied. Difficulties with word finding: Endorsed. She stated "I can't get words out like I'd like to." She alluded to some trouble with word production, as well as more traditional word finding concerns.  Decreased visuoperceptual ability: Denied.  Trajectory of deficits: Per medical records, Ms. Plagman reported experiencing a very sharp pain on the right side of her head back in 2018 which persisted for several minutes. During the current interview, she expressed a belief that something happened to her at that time which led to persisting cognitive difficulties. Her husband highlighted that all brain scans had not suggestive any stroke or other distinct event. However, she remained strong in this belief.   Difficulties completing ADLs: Denied. Her husband did have to remind her of a single time where she got lost while driving. However, this did not seem to be a regular occurrence or concern.   Additional Medical History: History of traumatic brain injury/concussion: Denied. History of stroke: Denied. History of seizure activity: Denied. History of known exposure to toxins: Denied. Symptoms of chronic pain: Denied. Experience of frequent headaches/migraines: Denied. Frequent instances of dizziness/vertigo: Denied.  Sensory changes: She wears glasses with benefit. Other sensory changes/difficulties (e.g., hearing, taste, smell) were denied.  Balance/coordination difficulties: Denied. She also denied any recent falls.  Other motor difficulties: Denied.  Sleep History: Estimated hours obtained each night: Variable. She reported getting around 3-4 hours on bad  nights and 8+ hours on good nights.  Difficulties falling asleep: Denied. Medications were said to be helpful in allowing her to fall asleep quickly. Difficulties staying asleep: Endorsed. She  reported commonly waking throughout the night, often for no known reason. She will then toss and turn and have trouble falling back asleep.  Feels rested and refreshed upon awakening: Variably depending on the quantity and quality of sleep obtained the night before.   History of snoring: Endorsed. History of waking up gasping for air: Denied. Witnessed breath cessation while asleep: Denied.  History of vivid dreaming: Denied. Excessive movement while asleep: Denied outside of tossing and turning behaviors mentioned above.  Instances of acting out her dreams: Denied.  Psychiatric/Behavioral Health History: Depression: She denied to her knowledge any prior formal diagnosis surrounding depression or another related mental health condition. She did acknowledge that her mood has been a bit "lower" lately. However, this was attributed to the fact that she will be retiring in the next few weeks and will no longer have regular contact with her co-workers. She denied previously working with a Administrator, [employer]. Current or remote suicidal ideation, intent, or plan was also denied.  Anxiety: Endorsed. She reported a history of generalized anxiety symptoms dating back several years at least. She stated that current medications are helpful (she appears to be taking Lexapro).  Mania: Denied. Trauma History: Denied. Visual/auditory hallucinations: Denied. Delusional thoughts: Denied.  Tobacco: Denied. Alcohol: She reported consuming 1-2 glasses of wine after she gets home from work on a near nightly basis. Her husband noted that on nights where she has a second drink, he feels that her temperament changes and she may exhibit more pronounced cognitive weaknesses. She denied a history of problematic alcohol abuse or  dependence.  Recreational drugs: Denied.  Family History: Problem Relation Age of Onset   Heart attack Mother    Heart attack Brother    Memory loss Brother    Dementia Maternal Aunt    This information was confirmed by Ms. Wachsmuth.  Academic/Vocational History: Highest level of educational attainment: 12 years. She graduated from high school and described herself as an average (B/C) student in academic settings. Math was noted as a likely relative weakness.  History of developmental delay: Denied. History of grade repetition: Denied. Enrollment in special education courses: Denied. History of LD/ADHD: Denied.  Employment: She currently works for a Teacher, adult education as an Web designer. She noted that she is in the process of retiring, which will likely occur in the next few weeks. She did acknowledge that trouble remembering client names did factor into her retirement decision to some extent.   Evaluation Results:   Behavioral Observations: Ms. Body was accompanied by her husband, arrived to her appointment on time, and was appropriately dressed and groomed. She appeared alert and oriented. Observed gait and station were within normal limits. Gross motor functioning appeared intact upon informal observation and no abnormal movements (e.g., tremors) were noted. Her affect was generally relaxed and positive. Spontaneous speech was fluent. Mild word finding difficulties were observed during interview. Thought processes were generally coherent, organized, and normal in content. Insight into her cognitive difficulties appeared poor in that she largely denied all cognitive concerns despite testing revealing diffuse significant impairment, especially surrounding learning and memory.   During testing, Ms. Quirindongo was pleasant. She required instruction repetition and clarification across a majority of presented tasks. Word finding difficulties were quite pronounced, especially  across expressive language tests. Sustained attention was appropriate. Task engagement was adequate and she persisted when challenged. Overall, Ms. Morosky was cooperative with the clinical interview and subsequent testing procedures.   Adequacy of Effort: The validity of neuropsychological testing is  limited by the extent to which the individual being tested may be assumed to have exerted adequate effort during testing. Ms. Alim expressed her intention to perform to the best of her abilities and exhibited adequate task engagement and persistence. Scores across stand-alone and embedded performance validity measures were variable. However, her single below expectation performance is most likely due to true cognitive impairment rather than poor engagement or attempts to perform poorly. As such, the results of the current evaluation are believed to be a valid representation of Ms. Berrey's current cognitive functioning.  Test Results: Ms. Furse was poorly oriented at the time of the current evaluation. She incorrectly stated her current age ("74"). While she was unsure of the city she resides in, she did eventually come across the correct answer. She was unable to state the current year ("19 something"), month, day of the week, time, or current location. When asked the reason for current testing, she responded with "They tell me I cannot remember."   Intellectual abilities based upon educational and vocational attainment were estimated to be in the average range. Premorbid abilities were estimated to be within the lower limits of the average range based upon a single-word reading test.   Processing speed was variable, ranging from the exceptionally low to above average normative ranges. Basic attention was average to exceptionally high. More complex attention (e.g., working memory) was well below average to below average. Executive functioning was exceptionally low. She did perform in the average range  across a task assessing safety and judgment.   Assessed receptive language abilities were exceptionally low. Greatest areas of difficulty across this task included sequencing commands, understanding complex sentence structure, and following multi-step commands. Assessed expressive language was below average across a task assessing phonemic fluency but exceptionally low across all other assessments. Confrontation naming impairment was notable for a mix of true word finding issues with intact comprehension, as well as other instances where she did not appear to recognize the object in front of her.     Assessed visuospatial/visuoconstructional abilities were exceptionally low to well below average. Points were lost on her drawing of a clock due to her being confused and ultimately unable to make an attempt drawing clock hands. Points were lost on her copy of a complex figure due to mild visual distortions, as well as her omitting two internal aspects altogether.    Learning (i.e., encoding) of novel verbal information was exceptionally low. Spontaneous delayed recall (i.e., retrieval) of previously learned information was also exceptionally low. Retention rates were 0% across a story learning task, 0% across a list learning task, and 0% across a figure drawing task. Performance across recognition tasks was also exceptionally low, suggesting no evidence for adequate information consolidation.   Results of emotional screening instruments suggested that recent symptoms of generalized anxiety were in the minimal range, while symptoms of depression were within normal limits. A screening instrument assessing recent sleep quality suggested the presence of minimal sleep dysfunction.  Tables of Scores:   Note: This summary of test scores accompanies the interpretive report and should not be considered in isolation without reference to the appropriate sections in the text. Descriptors are based on appropriate  normative data and may be adjusted based on clinical judgment. Terms such as "Within Normal Limits" and "Outside Normal Limits" are used when a more specific description of the test score cannot be determined.       Percentile - Normative Descriptor > 98 - Exceptionally High 91-97 - Well Above  Average 75-90 - Above Average 25-74 - Average 9-24 - Below Average 2-8 - Well Below Average < 2 - Exceptionally Low       Validity:   DESCRIPTOR       Dot Counting Test: --- --- Within Normal Limits  RBANS Effort Index: --- --- Outside Normal Limits  WAIS-IV Reliable Digit Span: --- --- Within Normal Limits       Orientation:      Raw Score Percentile   NAB Orientation, Form 1 20/29 --- ---       Cognitive Screening:      Raw Score Percentile   SLUMS: 8/30 --- ---       RBANS, Form A: Standard Score/ Scaled Score Percentile   Total Score 51 <1 Exceptionally Low  Immediate Memory 53 <1 Exceptionally Low    List Learning 3 1 Exceptionally Low    Story Memory 2 <1 Exceptionally Low  Visuospatial/Constructional 62 1 Exceptionally Low    Figure Copy 4 2 Well Below Average    Line Orientation 7/20 <2 Exceptionally Low  Language 40 <1 Exceptionally Low    Picture Naming 2/10 <2 Exceptionally Low    Semantic Fluency 1 <1 Exceptionally Low  Attention 97 42 Average    Digit Span 16 98 Exceptionally High    Coding 3 1 Exceptionally Low  Delayed Memory 40 <1 Exceptionally Low    List Recall 0/10 <2 Exceptionally Low    List Recognition 9/20 <2 Exceptionally Low    Story Recall 1 <1 Exceptionally Low    Story Recognition 4/12 <1 Exceptionally Low    Figure Recall 1 <1 Exceptionally Low    Figure Recognition 0/8 1 Exceptionally Low       Intellectual Functioning:      Standard Score Percentile   Test of Premorbid Functioning: 91 27 Average       Attention/Executive Function:     Trail Making Test (TMT): Raw Score (Scaled Score) Percentile     Part A 42 secs.,  0 errors (10) 50 Average     Part B Discontinued --- Impaired  *Based on [employer] Older Normative Studies (MOANS)           Scaled Score Percentile   WAIS-IV Digit Span: 6 9 Below Average    Forward 9 37 Average    Backward 5 5 Well Below Average    Sequencing 6 9 Below Average        Scaled Score Percentile   WAIS-IV Similarities: 1 <1 Exceptionally Low       D-KEFS Color-Word Interference Test: Raw Score (Scaled Score) Percentile     Color Naming 58 secs. (1) <1 Exceptionally Low    Word Reading 22 secs. (12) 75 Above Average    Inhibition 113 secs. (4) 2 Well Below Average      Total Errors 20 errors (1) <1 Exceptionally Low    Inhibition/Switching Discontinued --- Impaired      Total Errors --- --- ---       NAB Executive Functions Module, Form 1: T Score Percentile     Judgment 51 54 Average       Language:      Raw Score Percentile   PPVT Screening Instrument: 12/16 --- Outside Normal Limits  Sentence Repetition: 12/22 3 Well Below Average       Verbal Fluency Test: Raw Score (Scaled Score) Percentile     Phonemic Fluency (CFL) 21 (6) 9 Below Average    Category Fluency 7 (2) <1  Exceptionally Low  *Based on [employer] Older Normative Studies (MOANS)          NAB Language Module, Form 1: Standard Score/ T Score Percentile   Total Score 59 <1 Exceptionally Low    Oral Production 31 3 Well Below Average    Auditory Comprehension 25 1 Exceptionally Low    Naming 6/31 (19) <1 Exceptionally Low    Writing 26 1 Exceptionally Low    Bill Payment 33 5 Well Below Average       Visuospatial/Visuoconstruction:      Raw Score Percentile   Clock Drawing: 6/10 --- Impaired       Mood and Personality:      Raw Score Percentile   Geriatric Depression Scale: 8 --- Within Normal Limits  PROMIS Anxiety Questionnaire: 12 --- None to Slight       Additional Questionnaires:      Raw Score Percentile   PROMIS Sleep Disturbance Questionnaire: 24 --- None to Slight   Informed Consent and Coding/Compliance:    The current evaluation represents a clinical evaluation for the purposes previously outlined by the referral source and is in no way reflective of a forensic evaluation.   Ms. Tonche was provided with a verbal description of the nature and purpose of the present neuropsychological evaluation. Also reviewed were the foreseeable risks and/or discomforts and benefits of the procedure, limits of confidentiality, and mandatory reporting requirements of this provider. The patient was given the opportunity to ask questions and receive answers about the evaluation. Oral consent to participate was provided by the patient.   This evaluation was conducted by Christia Reading, Ph.D., licensed clinical neuropsychologist. Ms. Tim completed a clinical interview with Dr. Melvyn Novas, billed as one unit [phone], and 145 minutes of cognitive testing and scoring, billed as one unit [phone] and four additional units [id number]. Psychometrist Milana Kidney, B.S., assisted Dr. Melvyn Novas with test administration and scoring procedures. As a separate and discrete service, Dr. Melvyn Novas spent a total of 160 minutes in interpretation and report writing billed as one unit [phone] and two units [id number].

## 2021-03-17 NOTE — Progress Notes (Signed)
   Psychometrician Note   Cognitive testing was administered to Courtney Rangel by Milana Kidney, B.S. (psychometrist) under the supervision of Dr. Christia Reading, Ph.D., licensed psychologist on 03/17/21. Courtney Rangel did not appear overtly distressed by the testing session per behavioral observation or responses across self-report questionnaires. Rest breaks were offered.    The battery of tests administered was selected by Dr. Christia Reading, Ph.D. with consideration to Courtney Rangel's current level of functioning, the nature of her symptoms, emotional and behavioral responses during interview, level of literacy, observed level of motivation/effort, and the nature of the referral question. This battery was communicated to the psychometrist. Communication between Dr. Christia Reading, Ph.D. and the psychometrist was ongoing throughout the evaluation and Dr. Christia Reading, Ph.D. was immediately accessible at all times. Dr. Christia Reading, Ph.D. provided supervision to the psychometrist on the date of this service to the extent necessary to assure the quality of all services provided.    Courtney Rangel will return within approximately 1-2 weeks for an interactive feedback session with Dr. Melvyn Novas at which time her test performances, clinical impressions, and treatment recommendations will be reviewed in detail. Courtney Rangel understands she can contact our office should she require our assistance before this time.  A total of 145 minutes of billable time were spent face-to-face with Courtney Rangel by the psychometrist. This includes both test administration and scoring time. Billing for these services is reflected in the clinical report generated by Dr. Christia Reading, Ph.D.  This note reflects time spent with the psychometrician and does not include test scores or any clinical interpretations made by Dr. Melvyn Novas. The full report will follow in a separate note.

## 2021-03-18 ENCOUNTER — Encounter: Payer: Self-pay | Admitting: Psychology

## 2021-03-28 ENCOUNTER — Other Ambulatory Visit: Payer: Self-pay

## 2021-03-28 ENCOUNTER — Ambulatory Visit (INDEPENDENT_AMBULATORY_CARE_PROVIDER_SITE_OTHER): Payer: Medicare Other | Admitting: Psychology

## 2021-03-28 DIAGNOSIS — F0280 Dementia in other diseases classified elsewhere without behavioral disturbance: Secondary | ICD-10-CM

## 2021-03-28 DIAGNOSIS — G309 Alzheimer's disease, unspecified: Secondary | ICD-10-CM | POA: Diagnosis not present

## 2021-03-28 NOTE — Progress Notes (Addendum)
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  If no side effects in 4 weeks, then would start memantine titrating to '10mg'$  twice daily Advised no driving until she undergoes a formal driving evaluation conducted by OT.  Contacts provided. Recommended mental activities, socialization, exercise Advised to establish a POA Provided resources regarding alzheimer's support groups and assistance Follow up in 6 months with Etheleen Mayhew, PA-C in the memory clinic.  04/05/2021 ADDENDUM:  Patient underwent a behind the wheel driving evaluation on 03/31/2021 - at that time, she was found to safely operate a motor vehicle without restrictions. Metta Clines  Subjective:  Makynlie Franca is a 80 year old right-handed white female who follows up for worsening speech.  She is accompanied by her husband in the office and her daughters on phone.    UPDATE: Last seen in initial consultation in October 2020.  MRI of brain without contrast on 05/21/2019 personally reviewed was unremarkable.  Neuropsychological testing was ordered.  She was stubborn and held off on getting the testing.  She finally underwent neuropsychological evaluation on 03/17/2021 which was consistent with Alzheimer's dementia.  She still drives and husband says she seems okay driving.  She is retiring in 2 weeks.  Does not cook - trouble remembering recipes.  HISTORY: In 2018, she developed an episode of severe right sided facial pain radiating up the jaw to the temple lasting up to a few minutes.  No other associated focal symptoms.  Afterwards, she noticed speech difficulty.  It is not word-finding difficulty.  She knows what she wants to say but has trouble getting the words out.   She reports that it has gradually gotten worse.  She also has a dull persistent headache in her right temple.  No visual disturbance,  facial droop, facial numbness. Slurred speech or unilateral numbness or weakness.  She reports no difficulty reading or understanding other people when they speak to her.  She sometimes notes difficulty writing because her hand may shake.  She reports some short-term memory difficulty such as misplacing items but nothing significant that affects her independence and daily living.  She reports history of anxiety.   MRI of brain from 07/12/2017 demonstrated scattered periventricular and subcortical T2 hyperintensities in the cerebral white matter bilaterally, consistent with chronic small vessel ischemic changes, but no acute intracranial abnormality.  Labs, such as sed rate, CRP, CBC, CMP and TSH, were unremarkable.     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  She has no difficulty with her job except she has to concentrate to talk with people.  She uses a Art therapist.   She reports history of migraines in young adulthood but not for many years.    PAST MEDICAL HISTORY: Past Medical History:  Diagnosis Date   Ankle edema 12/21/2006   Generalized anxiety disorder    History of colon polyps    Hypercholesterolemia 12/21/2006   Major neurocognitive disorder due to Alzheimer's disease 03/17/2021   UTI (urinary tract infection)    Word finding difficulty     MEDICATIONS: Current Outpatient Medications on File Prior to Visit  Medication Sig Dispense Refill   atorvastatin (LIPITOR) 10 MG tablet Take 10 mg by mouth daily.     Cyanocobalamin (B-12 PO) Take 5,000 mg by mouth daily.  escitalopram (LEXAPRO) 10 MG tablet Take 10 mg by mouth daily.      No current facility-administered medications on file prior to visit.    ALLERGIES: No Known Allergies  FAMILY HISTORY: Family History  Problem Relation Age of Onset   Heart attack Mother    Heart attack Brother    Memory loss Brother     Dementia Maternal Aunt       Objective:  Blood pressure 116/62, pulse 82, height '5\' 4"'$  (1.626 m), weight 152 lb 9.6 oz (69.2 kg), SpO2 96 %. General: No acute distress.  Patient appears well-groomed.   Head:  Normocephalic/atraumatic Eyes:  Fundi examined but not visualized Neck: supple, no paraspinal tenderness, full range of motion Heart:  Regular rate and rhythm Lungs:  Clear to auscultation bilaterally Back: No paraspinal tenderness Neurological Exam: alert and oriented to person, place, and time.  Speech fluent and not dysarthric, language intact.   [employer] Mental Exam 03/29/2021  Weekday Correct 1  Current year 1  What state are we in? 1  Amount spent 0  Amount left 0  # of Animals 0  5 objects recall 0  Number series 1  Hour markers 2  Time correct 2  Placed X in triangle correctly 1  Largest Figure 1  Name of female 2  Date back to work 0  Type of work 0  State she lived in 0  Total score 12   CN II-XII intact. Bulk and tone normal, muscle strength 5/5 throughout.  Sensation to light touch intact.  Deep tendon reflexes 2+ throughout  Finger to nose testing intact.  Gait normal, Romberg negative.   Metta Clines, DO  CC: Lavone Orn, MD

## 2021-03-28 NOTE — Progress Notes (Signed)
   Neuropsychology Feedback Session Courtney Rangel. [place] [facility] Neu[facility]  Reason for Referral:   Courtney Rangel is a 80 y.o. right-handed Caucasian female referred by  Lavone Orn, M.D. , to characterize her current cognitive functioning and assist with diagnostic clarity and treatment planning in the context of subjective cognitive decline and expressive language concerns.   Feedback:   [name] completed a comprehensive neuropsychological evaluation on 03/17/2021. Please refer to that encounter for the full report and recommendations. Briefly, results suggested profound impairment surrounding all aspects of learning and memory. There was also notable impairment exhibited across executive functioning, receptive language, expressive language, and visuospatial abilities. Performance variability was exhibited across processing speed and complex attention. Basic attention represented her lone strength. 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 Across memory tasks, [name] had trouble learning information and did not benefit from repeated learning trials. After a brief delay, she was fully amnestic across all memory tasks and made comments towards the psychometrist to suggest that she did not recall ever being presented this information 15 minutes earlier. Finally, she performed very poorly across yes/no recognition trials. Taken together, this suggests a severe impairment surrounding memory storage, which is the hallmark characteristic of this disease process. Additional impairments surrounding confrontation naming, semantic fluency, visuospatial abilities, and executive functioning are also unfortunately quite consistent with expected patterns of disease progression.  Ms. Orders was accompanied by her husband in person and her daughters via speakerphone. Content of the current session focused on the results of her  neuropsychological evaluation. [name] and her family were given the opportunity to ask questions and their questions were answered. They were encouraged to reach out should additional questions arise. A copy of her report was provided at the conclusion of the visit.      40 minutes were spent conducting the current feedback session with [name], billed as one unit [phone].

## 2021-03-29 ENCOUNTER — Encounter: Payer: Self-pay | Admitting: Neurology

## 2021-03-29 ENCOUNTER — Ambulatory Visit: Payer: Medicare Other | Admitting: Neurology

## 2021-03-29 VITALS — BP 116/62 | HR 82 | Ht 64.0 in | Wt 152.6 lb

## 2021-03-29 DIAGNOSIS — F0280 Dementia in other diseases classified elsewhere without behavioral disturbance: Secondary | ICD-10-CM | POA: Diagnosis not present

## 2021-03-29 DIAGNOSIS — G309 Alzheimer's disease, unspecified: Secondary | ICD-10-CM | POA: Diagnosis not present

## 2021-03-29 MED ORDER — DONEPEZIL HCL 5 MG PO TABS: 5.0000 mg | ORAL_TABLET | Freq: Every day | ORAL | 0 refills | Status: DC

## 2021-03-29 NOTE — Patient Instructions (Signed)
We will start donepezil (Aricept) '5mg'$  daily for four weeks.  If you are tolerating the medication, then after four weeks, we will increase the dose to '10mg'$  daily.  Side effects include nausea, vomiting, diarrhea, vivid dreams, and muscle cramps.  Please call the clinic if you experience any of these symptoms. After you have been on donepezil '10mg'$  daily for 4 weeks, contact me and we will add a second memory medication called memantine Please contact for formal driving evaluation.  Do not drive until you have had the test Have help monitoring medications. Remain social Routine exercise such as walks Mental exercises such as crossword puzzles and brain teasers Follow up with Etheleen Mayhew PA-C in 6 months.

## 2021-04-01 ENCOUNTER — Telehealth: Payer: Self-pay | Admitting: Neurology

## 2021-04-01 NOTE — Telephone Encounter (Signed)
Pt husband called in, said the new medication jaffe put his wife on is making her sick. Said she has taken it for two nights and both times she has gotten really sick. Stated she has never been this sick before. They need advise on what to do.

## 2021-04-01 NOTE — Telephone Encounter (Signed)
Advised pt husband of Dr.Jaffe note, Just stop it.  When she is feeling better, they can contact us and we can try an alternative medication.   Autry verbalized understanding.

## 2021-04-01 NOTE — Telephone Encounter (Signed)
Telephone call to the pt husband pt taking the Donepezil for two days.  every time she taking it she has woken up sick. Pt has vomited twice today and sick on her stomach.   Pt had dinner at 6 pm and taken the medication around 8:30 or 9 pm.  Please advise.

## 2021-04-20 ENCOUNTER — Other Ambulatory Visit: Payer: Self-pay | Admitting: Neurology

## 2021-04-20 ENCOUNTER — Telehealth: Payer: Self-pay

## 2021-04-20 NOTE — Telephone Encounter (Signed)
Message Left by Pt husband, Per husband someone told him to keep giving the pt the medication to see how she do. After we advised to stop the Donepezil.    Per pt husband, Pt doing great on the 5 mg. Will it be okay to increase as discussed at the pt visit.  Please advise.

## 2021-04-22 NOTE — Telephone Encounter (Signed)
After she has been on donepezil 5mg  daily for 4 weeks, he should contact us and we can increase dose back up to 10mg  daily.  Per DR.Jaffe.   Pt wife picked up, Phone. Pt she thinks at the end of the eight pills she has been taking the 5 mg daily for 4 weeks.    Please increase Donepezil to 10 mg.

## 2021-04-22 NOTE — Telephone Encounter (Signed)
Pt husband Is calling back to speak with sheena about wifes medicine.

## 2021-04-25 ENCOUNTER — Telehealth: Payer: Self-pay | Admitting: Neurology

## 2021-04-25 ENCOUNTER — Other Ambulatory Visit: Payer: Self-pay | Admitting: Neurology

## 2021-04-25 NOTE — Telephone Encounter (Signed)
Pt husband called, said he is still waiting for the RX donepezil to be sent in to pharmacy. Was told it should have been sent last week.

## 2021-04-26 MED ORDER — DONEPEZIL HCL 10 MG PO TABS: 10.0000 mg | ORAL_TABLET | Freq: Every day | ORAL | 3 refills | Status: DC

## 2021-04-26 NOTE — Telephone Encounter (Signed)
Telephone call to pt husband, what pharmacy should the script go to?  Per husband Please send script to [facility] in [place].  Script sent.

## 2021-06-27 ENCOUNTER — Ambulatory Visit: Payer: Medicare Other | Admitting: Neurology

## 2021-08-15 ENCOUNTER — Other Ambulatory Visit: Payer: Self-pay | Admitting: Neurology

## 2021-09-14 IMAGING — MR MR HEAD W/O CM
10 series · 48 of 48 positions shown · non-contrast
Comparison: Brain MRI 07/12/2017

CLINICAL DATA: Expressive aphasia. Additional history provided:
Difficulty with speech/temporal headaches for 2 years, sharp pain
right-side of head prior to symptoms.

EXAM:
MRI HEAD WITHOUT CONTRAST
TECHNIQUE: Multiplanar, multiecho pulse sequences of the brain and surrounding
structures were obtained without intravenous contrast.

[Series 2: t1_se_sag · sagittal · 5.0mm · 0.45mm/px · 1 of 21 slices shown]
[im 1/21]
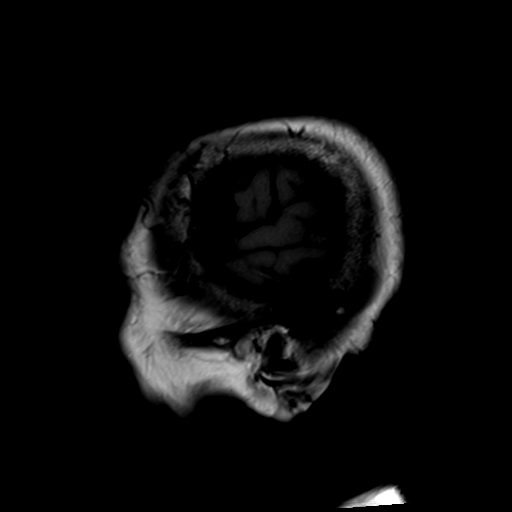

[Series 3: t2_tse_tra_512 · axial · 5.0mm · 0.60mm/px · z∈[-60,+78]mm · 2 of 23 slices shown]
[im 1/23]
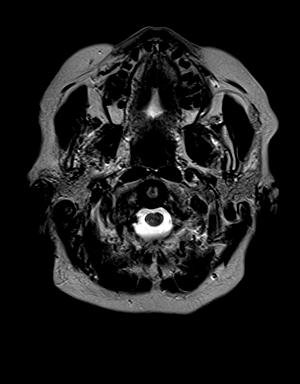
[im 23/23]
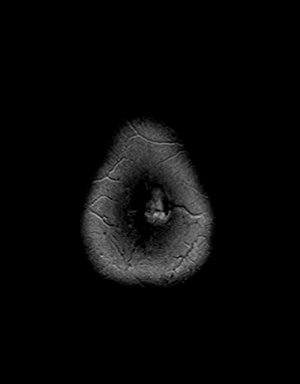

[Series 4: ep2d_diff_ · axial · 3.0mm · 1.80mm/px · z∈[-60,+75]mm · 7 of 91 slices shown]
[im 1/91]
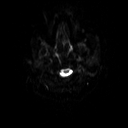
[im 16/91]
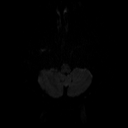
[im 31/91]
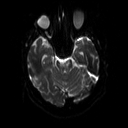
[im 46/91]
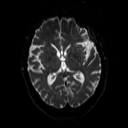
[im 61/91]
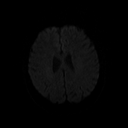
[im 76/91]
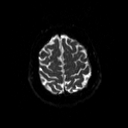
[im 91/91]
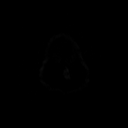

[Series 5: ep2d_diff__adc · axial · 3.0mm · 1.80mm/px · z∈[-60,+75]mm · 4 of 46 slices shown]
[im 1/46]
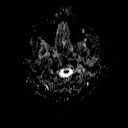
[im 16/46]
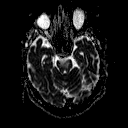
[im 31/46]
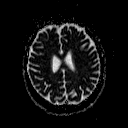
[im 46/46]
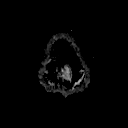

[Series 6: ep2d_diff_cor · coronal · 5.0mm · 1.77mm/px · 4 of 49 slices shown]
[im 1/49]
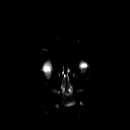
[im 17/49]
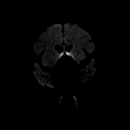
[im 33/49]
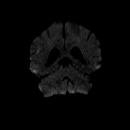
[im 49/49]
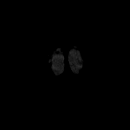

[Series 7: ep2d_diff_cor_adc · coronal · 5.0mm · 1.77mm/px · 2 of 25 slices shown]
[im 1/25]
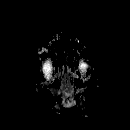
[im 25/25]
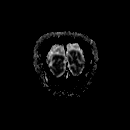

[Series 9: swi_images · axial · 4.0mm · 0.90mm/px · z∈[-61,+79]mm · 12 of 144 slices shown]
[im 1/144]
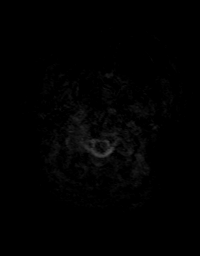
[im 14/144]
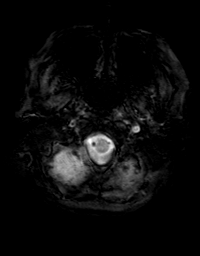
[im 27/144]
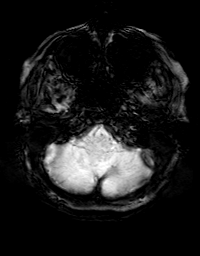
[im 40/144]
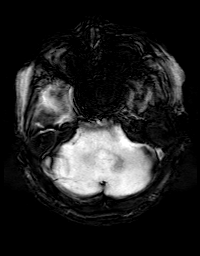
[im 53/144]
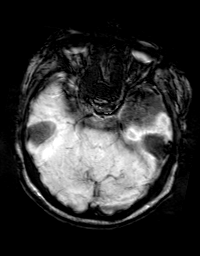
[im 66/144]
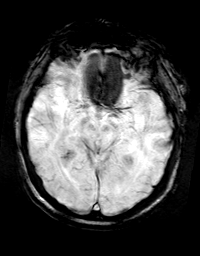
[im 79/144]
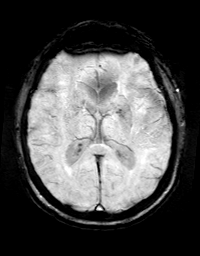
[im 92/144]
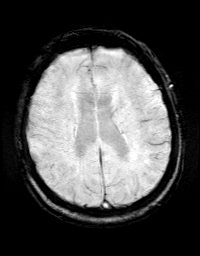
[im 105/144]
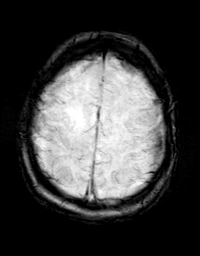
[im 118/144]
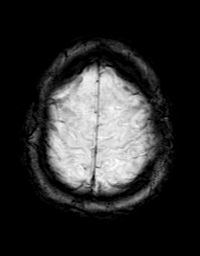
[im 131/144]
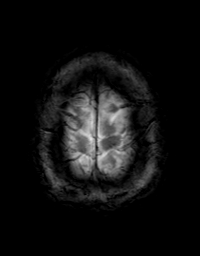
[im 144/144]
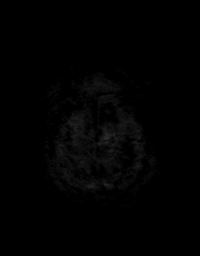

[Series 10: FLAIR · axial · 3.0mm · 0.43mm/px · z∈[-70,+86]mm · 2 of 27 slices shown]
[im 1/27]
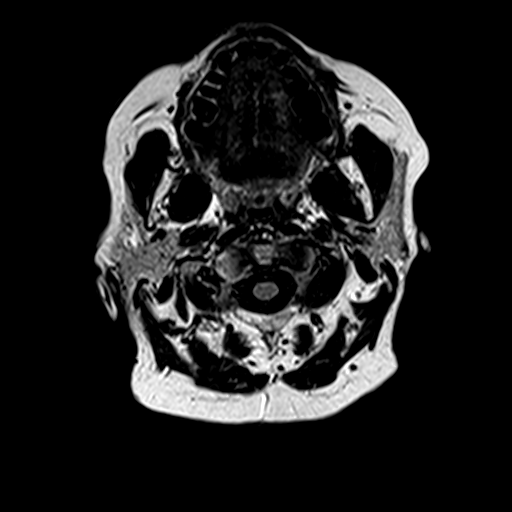
[im 27/27]
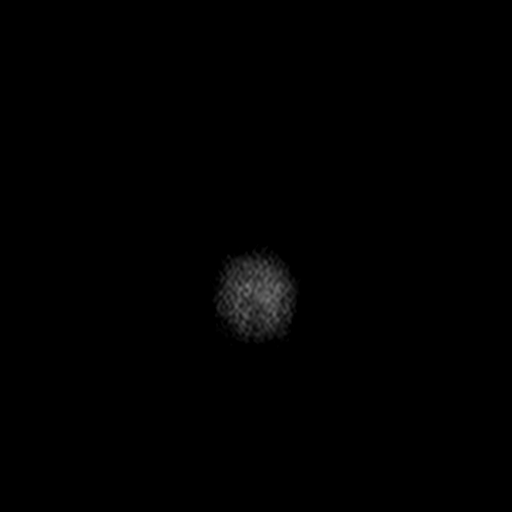

[Series 11: t1_mpr_tra · axial · 1.0mm · 0.72mm/px · z∈[-62,+80]mm · 12 of 144 slices shown]
[im 1/144]
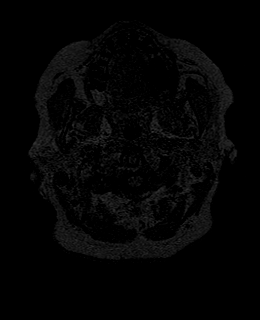
[im 14/144]
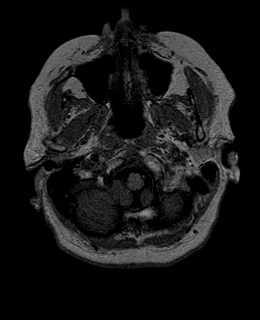
[im 27/144]
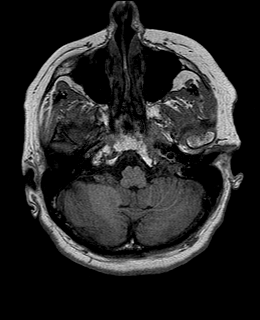
[im 40/144]
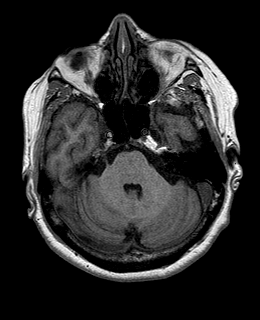
[im 53/144]
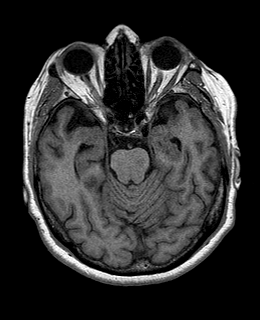
[im 66/144]
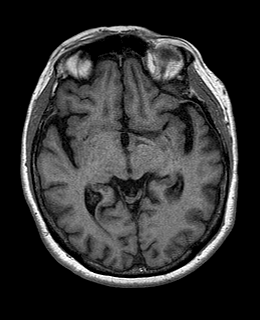
[im 79/144]
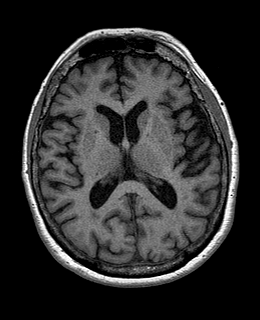
[im 92/144]
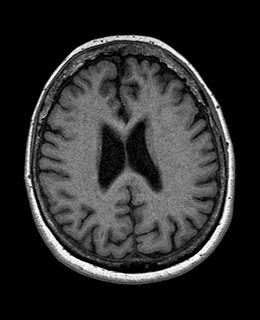
[im 105/144]
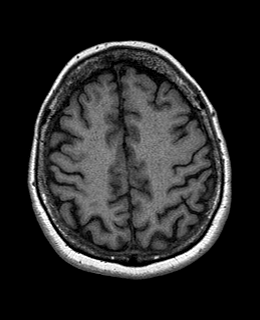
[im 118/144]
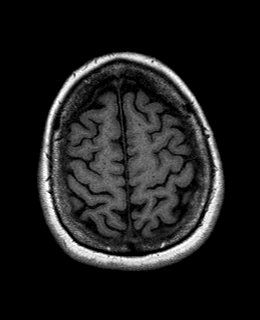
[im 131/144]
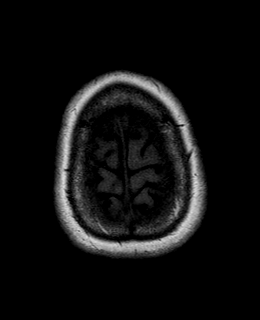
[im 144/144]
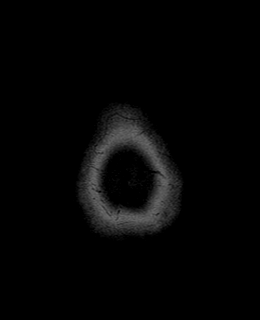

[Series 12: T2 · coronal · 5.0mm · 0.45mm/px · 2 of 26 slices shown]
[im 1/26]
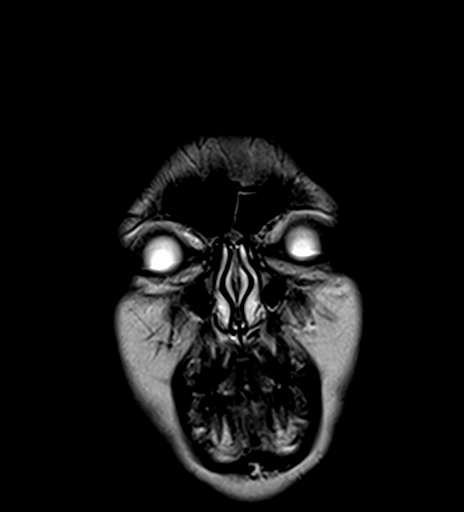
[im 26/26]
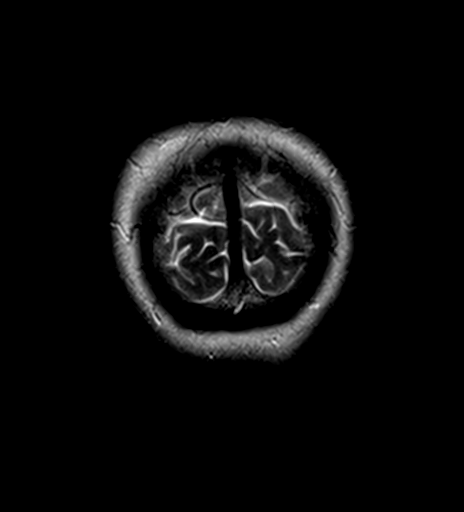

[48 of 48 positions shown; findings below may reference images not displayed]

FINDINGS: Brain:

There is no evidence of acute infarct.

No evidence of intracranial mass.

No midline shift or extra-axial fluid collection.

The susceptibility weighted imaging is significantly motion
degraded. No chronic intracranial blood products identified.

Mild scattered T2/FLAIR hyperintensity within the cerebral white
matter and pons is nonspecific, but consistent with chronic small
vessel ischemic disease. Prominent perivascular spaces within the
basal ganglia.

Cerebral volume is normal for age.

Vascular: Flow voids maintained within the proximal large arterial
vessels.

Skull and upper cervical spine: No focal marrow lesion. Incompletely
assessed upper cervical spondylosis. C3-C4 and C4-C5 posterior disc
osteophytes narrow the spinal canal.

Sinuses/Orbits: Bilateral lens replacements. Minimal ethmoid sinus
mucosal thickening. Trace fluid within bilateral mastoid air cells.
IMPRESSION: 1. No evidence of acute intracranial abnormality.
2. Stable, mild chronic small vessel ischemic disease.
3. Trace bilateral mastoid effusions.

## 2021-09-25 NOTE — Progress Notes (Signed)
Came in late, not seen

## 2021-09-26 ENCOUNTER — Ambulatory Visit: Payer: Medicare Other | Admitting: Physician Assistant

## 2021-09-26 DIAGNOSIS — Z91199 Patient's noncompliance with other medical treatment and regimen due to unspecified reason: Secondary | ICD-10-CM

## 2021-09-27 ENCOUNTER — Ambulatory Visit: Payer: Medicare Other | Admitting: Physician Assistant

## 2021-09-27 ENCOUNTER — Encounter: Payer: Self-pay | Admitting: Physician Assistant

## 2021-09-27 ENCOUNTER — Other Ambulatory Visit: Payer: Self-pay

## 2021-09-27 VITALS — BP 129/69 | HR 74 | Resp 18 | Ht 64.0 in | Wt 140.0 lb

## 2021-09-27 DIAGNOSIS — G309 Alzheimer's disease, unspecified: Secondary | ICD-10-CM

## 2021-09-27 DIAGNOSIS — F0280 Dementia in other diseases classified elsewhere without behavioral disturbance: Secondary | ICD-10-CM

## 2021-09-27 MED ORDER — MEMANTINE HCL 5 MG PO TABS: ORAL_TABLET | ORAL | 11 refills | Status: DC

## 2021-09-27 NOTE — Progress Notes (Signed)
? ?NEUROLOGY FOLLOW UP OFFICE NOTE ? ?Courtney Rangel ?[id number] ? ?Assessment/Plan:  ? 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 ?Prior slums was 12/30, currently her MMSE is 19/30, but despite the similarity of value, at the same time, there is obvious difficulty with recall 0 of 3.  She also has a decrease in orientation, suspicious for worsening of disease. ? ?1.   Continue donepezil '10mg'$  at bedtime.   ?Start Memantine 5 mg: Take 1 tablet (5 mg at night) for 2 weeks, then increase to 1 tablet (5 mg) twice a day . Side effects discussed  ?Recommended mental activities, socialization, exercise ?Advised to establish a POA and LW  ?Provided resources regarding alzheimer's support groups and assistance ?Follow up in 6 months  ? ? ? ?Subjective:  ?Courtney Rangel is a 81 year old right-handed white female who follows up for memory changes.  She is accompanied by her husband who supplements the history.  The patient is currently on donepezil 10 mg daily, tolerating the medicine well.  Prior records and films have been reviewed.  His latest MRI of the brain without contrast on 05/21/2019 was unremarkable.  Neuropsychological testing on 03/17/2021 was consistent with Alzheimer's dementia.  ?Since her last visit, the patient reports that her memory is about the same.  However, her husband disagrees, he feels that her memory is worse, repeating the same stories but not the same questions. ?She has had a harder time retrieving the words "need a little bit of time".  There is no aphasia however.  She denies any headaches as before, visual disturbance, facial droop or facial numbness.  There is no slurred speech.  She denies leaving objects in unusual places.  Her mood is stable, she has okay she had a moments of anxiety and irritability due to memory issues.  She is not as active as she would like to be, but her husband is to increase her ambulation soon, as the weather gets better.  She does not enjoy  doing crossword puzzles or word finding.  She sleeps well, denies any vivid dreams or sleepwalking, hallucinations or paranoia.  There are no hygiene concerns, she is independent of bathing and dressing.  Her husband supervises the medications and finances.  She does not cook.  She continues to drive, having passed the driving test recently as of 03/2021, without restrictions.  Her appetite is normal, denies trouble swallowing.  She ambulates without difficulty, denies any recent falls or head injuries.  She denies double vision, seizures, tremors or anosmia, urine incontinence, retention, constipation or diarrhea. ? ? ?HISTORY: ?In 2018, she developed an episode of severe right sided facial pain radiating up the jaw to the temple lasting up to a few minutes.  No other associated focal symptoms.  Afterwards, she noticed speech difficulty.  It is not word-finding difficulty.  She knows what she wants to say but has trouble getting the words out.   She reports that it has gradually gotten worse.  She also has a dull persistent headache in her right temple.  No visual disturbance, facial droop, facial numbness. Slurred speech or unilateral numbness or weakness.  She reports no difficulty reading or understanding other people when they speak to her.  She sometimes notes difficulty writing because her hand may shake.  She reports some short-term memory difficulty such as misplacing items but nothing significant that affects her independence and daily living.  She reports history of anxiety. ?  ?MRI of brain from 07/12/2017 demonstrated  scattered periventricular and subcortical T2 hyperintensities in the cerebral white matter bilaterally, consistent with chronic small vessel ischemic changes, but no acute intracranial abnormality.  Labs, such as sed rate, CRP, CBC, CMP and TSH, were unremarkable.  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  She has no difficulty with her job except she has to concentrate to talk with people.  She uses a Art therapist. ?  ?She reports history of migraines in young adulthood but not for many years.   ? ?PAST MEDICAL HISTORY: ?Past Medical History:  ?Diagnosis Date  ? Ankle edema 12/21/2006  ? Generalized anxiety disorder   ? History of colon polyps   ? Hypercholesterolemia 12/21/2006  ? Major neurocognitive disorder due to Alzheimer's disease 03/17/2021  ? UTI (urinary tract infection)   ? Word finding difficulty   ? ? ?MEDICATIONS: ?Current Outpatient Medications on File Prior to Visit  ?Medication Sig Dispense Refill  ? atorvastatin (LIPITOR) 10 MG tablet Take 10 mg by mouth daily.    ? Cyanocobalamin (B-12 PO) Take 5,000 mg by mouth daily.    ? donepezil (ARICEPT) 10 MG tablet TAKE 1 TABLET BY MOUTH EVERYDAY AT BEDTIME 90 tablet 0  ? escitalopram (LEXAPRO) 10 MG tablet Take 10 mg by mouth daily.     ? ?No current facility-administered medications on file prior to visit.  ? ? ?ALLERGIES: ?No Known Allergies ? ?FAMILY HISTORY: ?Family History  ?Problem Relation Age of Onset  ? Heart attack Mother   ? Heart attack Brother   ? Memory loss Brother   ? Dementia Maternal Aunt   ? ? ?  ?Objective:  ?BP 129/69   Pulse 74   Resp 18   Ht '5\' 4"'$  (1.626 m)   Wt 140 lb (63.5 kg)   SpO2 98%   BMI 24.03 kg/m?  ? ?General: No acute distress.  Patient appears well-groomed.   ?Head:  Normocephalic/atraumatic ?Eyes:  Fundi examined but not visualized ?Neck: supple, no paraspinal tenderness, full range of motion ?Heart:  Regular rate and rhythm ?Lungs:  Clear to auscultation bilaterally ?Back: No paraspinal tenderness ?Neurological Exam: alert and oriented to person, place not to time. Speech fluent and not dysarthric, language intact.   ? ?[employer] Mental Exam 03/29/2021  ?Weekday Correct 1  ?Current year 1  ?What state are we in? 1  ?Amount spent 0  ?Amount left 0  ?# of Animals 0  ?5 objects  recall 0  ?Number series 1  ?Hour markers 2  ?Time correct 2  ?Placed X in triangle correctly 1  ?Largest Figure 1  ?Name of female 2  ?Date back to work 0  ?Type of work 0  ?State she lived in 0  ?Total score 12  ? ? ?MMSE - Mini Mental State Exam 09/27/2021  ?Orientation to time 1  ?Orientation to Place 5  ?Registration 3  ?Attention/ Calculation 3  ?Recall 0  ?Language- name 2 objects 2  ?Language- repeat 1  ?Language- follow 3 step command 3  ?Language- read & follow direction 1  ?Write a sentence 0  ?Copy design 0  ?Total score 19  ?  ? ?CN II-XII intact. Bulk and tone normal, muscle strength 5/5 throughout.  Sensation to light touch intact.  Deep tendon reflexes 2+ throughout  Finger to nose testing intact.  Gait normal, Romberg negative. ? ?Sharene Butters, PA-C  ? ?CC: Lavone Orn,  MD ? ? ? ?

## 2021-09-27 NOTE — Patient Instructions (Addendum)
It was a pleasure to see you today at our office.  ? ?Recommendations: ? ?Meds: ? ? Continue donepezil  '10mg'$  daily.   ?Start Memantine '5mg'$  tablets.  Take 1 tablet at bedtime for 2 weeks, then 1 tablet twice daily.   Side effects include dizziness, headache, diarrhea or constipation.  Call if severe symptoms appear  ?Please contact for formal driving evaluation.  Do not drive until you have had the test ?Have help monitoring medications. ?Remain social ?Routine exercise such as walks ?Mental exercises such as crossword puzzles and brain teasers ?Follow up in 6 months. ? ?RECOMMENDATIONS FOR ALL PATIENTS WITH MEMORY PROBLEMS: ?1. Continue to exercise (Recommend 30 minutes of walking everyday, or 3 hours every week) ?2. Increase social interactions - continue going to [place] and enjoy social gatherings with friends and family ?3. Eat healthy, avoid fried foods and eat more fruits and vegetables ?4. Maintain adequate blood pressure, blood sugar, and blood cholesterol level. Reducing the risk of stroke and cardiovascular disease also helps promoting better memory. ?5. Avoid stressful situations. Live a simple life and avoid aggravations. Organize your time and prepare for the next day in anticipation. ?6. Sleep well, avoid any interruptions of sleep and avoid any distractions in the bedroom that may interfere with adequate sleep quality ?7. Avoid sugar, avoid sweets as there is a strong link between excessive sugar intake, diabetes, and cognitive impairment ?We discussed the Mediterranean diet, which has been shown to help patients reduce the risk of progressive memory disorders and reduces cardiovascular risk. This includes eating fish, eat fruits and green leafy vegetables, nuts like almonds and hazelnuts, walnuts, and also use olive oil. Avoid fast foods and fried foods as much as possible. Avoid sweets and sugar as sugar use has been linked to worsening of memory function. ? ?There is always a concern of gradual  progression of memory problems. If this is the case, then we may need to adjust level of care according to patient needs. Support, both to the patient and caregiver, should then be put into place.  ? ? ?The Alzheimer?s Association is here all day, every day for people facing Alzheimer?s disease through our free 24/7 Helpline: [phone]. The Helpline provides reliable information and support to all those who need assistance, such as individuals living with memory loss, Alzheimer's or other dementia, caregivers, health care professionals and the public.  ?Our highly trained and knowledgeable staff can help you with: ?Understanding memory loss, dementia and Alzheimer's  ?Medications and other treatment options  ?General information about aging and brain health  ?Skills to provide quality care and to find the best care from professionals  ?Legal, financial and living-arrangement decisions ?Our Helpline also features: ?Confidential care consultation provided by master's level clinicians who can help with decision-making support, crisis assistance and education on issues families face every day  ?Help in a caller's preferred language using our translation service that features more than 200 languages and dialects  ?Referrals to local community programs, services and ongoing support ? ? ? ? ?FALL PRECAUTIONS: Be cautious when walking. Scan the area for obstacles that may increase the risk of trips and falls. When getting up in the mornings, sit up at the edge of the bed for a few minutes before getting out of bed. Consider elevating the bed at the head end to avoid drop of blood pressure when getting up. Walk always in a well-lit room (use night lights in the walls). Avoid area rugs or power cords from appliances in  the middle of the walkways. Use a walker or a cane if necessary and consider physical therapy for balance exercise. Get your eyesight checked regularly. ? ?FINANCIAL OVERSIGHT: Supervision, especially  oversight when making financial decisions or transactions is also recommended. ? ?HOME SAFETY: Consider the safety of the kitchen when operating appliances like stoves, microwave oven, and blender. Consider having supervision and share cooking responsibilities until no longer able to participate in those. Accidents with firearms and other hazards in the house should be identified and addressed as well. ? ? ?ABILITY TO BE LEFT ALONE: If patient is unable to contact 911 operator, consider using LifeLine, or when the need is there, arrange for someone to stay with patients. Smoking is a fire hazard, consider supervision or cessation. Risk of wandering should be assessed by caregiver and if detected at any point, supervision and safe proof recommendations should be instituted. ? ?MEDICATION SUPERVISION: Inability to self-administer medication needs to be constantly addressed. Implement a mechanism to ensure safe administration of the medications. ? ? ?DRIVING: Regarding driving, in patients with progressive memory problems, driving will be impaired. We advise to have someone else do the driving if trouble finding directions or if minor accidents are reported. Independent driving assessment is available to determine safety of driving. ? ? ?If you are interested in the driving assessment, you can contact the following: ? ?[employer] in [place] ? ?[place] [phone] ? ?[facility] [phone] ? ?[facility] [phone] or [phone] ? ?  ? ? ?Mediterranean Diet ?A Mediterranean diet refers to food and lifestyle choices that are based on the traditions of countries located on the [employer]. This way of eating has been shown to help prevent certain conditions and improve outcomes for people who have chronic diseases, like kidney disease and heart disease. ?What are tips for following this plan? ?Lifestyle  ?Cook and eat meals together with your  family, when possible. ?Drink enough fluid to keep your urine clear or pale yellow. ?Be physically active every day. This includes: ?Aerobic exercise like running or swimming. ?Leisure activities like gardening, walking, or housework. ?Get 7-8 hours of sleep each night. ?If recommended by your health care provider, drink red wine in moderation. This means 1 glass a day for nonpregnant women and 2 glasses a day for men. A glass of wine equals 5 oz (150 mL). ?Reading food labels  ?Check the serving size of packaged foods. For foods such as rice and pasta, the serving size refers to the amount of cooked product, not dry. ?Check the total fat in packaged foods. Avoid foods that have saturated fat or trans fats. ?Check the ingredients list for added sugars, such as corn syrup. ?Shopping  ?At the grocery store, buy most of your food from the areas near the walls of the store. This includes: ?Fresh fruits and vegetables (produce). ?Grains, beans, nuts, and seeds. Some of these may be available in unpackaged forms or large amounts (in bulk). ?Fresh seafood. ?Poultry and eggs. ?Low-fat dairy products. ?Buy whole ingredients instead of prepackaged foods. ?Buy fresh fruits and vegetables in-season from local farmers markets. ?Buy frozen fruits and vegetables in resealable bags. ?If you do not have access to quality fresh seafood, buy precooked frozen shrimp or canned fish, such as tuna, salmon, or sardines. ?Buy small amounts of raw or cooked vegetables, salads, or olives from the deli or salad bar at your store. ?Stock your pantry so you always have certain foods on hand, such as  olive oil, canned tuna, canned tomatoes, rice, pasta, and beans. ?Cooking  ?Cook foods with extra-virgin olive oil instead of using butter or other vegetable oils. ?Have meat as a side dish, and have vegetables or grains as your main dish. This means having meat in small portions or adding small amounts of meat to foods like pasta or stew. ?Use beans  or vegetables instead of meat in common dishes like chili or lasagna. ?Experiment with different cooking methods. Try roasting or broiling vegetables instead of steaming or saut?eing them. ?Add frozen vegetab

## 2021-10-19 ENCOUNTER — Other Ambulatory Visit: Payer: Self-pay | Admitting: Physician Assistant

## 2021-11-10 ENCOUNTER — Other Ambulatory Visit: Payer: Self-pay | Admitting: Neurology

## 2021-11-14 ENCOUNTER — Other Ambulatory Visit: Payer: Self-pay | Admitting: Neurology

## 2022-02-09 ENCOUNTER — Other Ambulatory Visit: Payer: Self-pay | Admitting: Physician Assistant

## 2022-03-14 ENCOUNTER — Telehealth: Payer: Self-pay | Admitting: Physician Assistant

## 2022-03-14 NOTE — Telephone Encounter (Signed)
Pt's husband called in stating the pt has been doing well with her medications except when she drinks wine. It seems to really interfere with her when she is drinking. He would like to know if it's ok for her to drink wine with the medication? He didn't want to bring it up in the visit due to it making her upset.

## 2022-04-04 NOTE — Progress Notes (Signed)
Assessment/Plan:   Dementia due to Alzheimer's Disease Courtney Rangel is a very pleasant 81 y.o. RH female seen today with a history of Alzheimer's dementia, confirmed by neurocognitive testing  on 03/2021, seen in follow up for memory loss. Patient is currently on donepezil qo mg qhs and memantine 5 mg bid . MRI brain personally reviewed was remarkable for  MMSE today is   /30  with delayed recall  /3       Follow up in   months.    Subjective:    This patient is accompanied in the office by  who supplements the history.  Previous records as well as any outside records available were reviewed prior to todays visit. Last seen on 09/27/21. Last MMSE 09/2021 was 19/30    Any changes in memory since last visit? repeats oneself?  Endorsed Disoriented when walking into a room?  Patient denies   Leaving objects in unusual places?  Patient denies   Ambulates  with difficulty?   Patient denies   Recent falls?  Patient denies   Any head injuries?  Patient denies   History of seizures?   Patient denies   Wandering behavior?  Patient denies   Patient drives?  Short distances ***. Passed her test 03/2021 Any mood changes since last visit?  Patient denies   Any worsening depression?:  Patient denies   Hallucinations?  Patient denies   Paranoia?  Patient denies   Patient reports that sleeps well without vivid dreams, REM behavior or sleepwalking   History of sleep apnea?  Patient denies   Any hygiene concerns?  Patient denies   Independent of bathing and dressing?  Endorsed  Does the patient needs help with medications? Husband supervises Who is in charge of the finances?  Husband supervises.   Any changes in appetite?  Patient denies   Patient have trouble swallowing? Patient denies   Does the patient cook?  Patient denies   Any kitchen accidents such as leaving the stove on? Patient denies   Any headaches?  Patient denies   Double vision? Patient denies   Any focal numbness or  tingling?  Patient denies   Chronic back pain Patient denies   Unilateral weakness?  Patient denies   Any tremors?  Patient denies   Any history of anosmia?  Patient denies   Any incontinence of urine?  Patient denies   Any bowel dysfunction?   Patient denies      Patient lives with:  Courtney Rangel is a 81 year old right-handed white female who follows up for memory changes.  She is accompanied by her husband who supplements the history.  The patient is currently on donepezil 10 mg daily, tolerating the medicine well.  Prior records and films have been reviewed.  His latest MRI of the brain without contrast on 05/21/2019 was unremarkable.  Neuropsychological testing on 03/17/2021 was consistent with Alzheimer's dementia.  Since her last visit, the patient reports that her memory is about the same.  However, her husband disagrees, he feels that her memory is worse, repeating the same stories but not the same questions. She has had a harder time retrieving the words "need a little bit of time".  There is no aphasia however.  She denies any headaches as before, visual disturbance, facial droop or facial numbness.  There is no slurred speech.  She denies leaving objects in unusual places.  Her mood is stable, she has okay she had a moments of anxiety and  irritability due to memory issues.  She is not as active as she would like to be, but her husband is to increase her ambulation soon, as the weather gets better.  She does not enjoy doing crossword puzzles or word finding.  She sleeps well, denies any vivid dreams or sleepwalking, hallucinations or paranoia.  There are no hygiene concerns, she is independent of bathing and dressing.  Her husband supervises the medications and finances.  She does not cook.  She continues to drive, having passed the driving test recently as of 03/2021, without restrictions.  Her appetite is normal, denies trouble swallowing.  She ambulates without difficulty, denies any recent falls  or head injuries.  She denies double vision, seizures, tremors or anosmia, urine incontinence, retention, constipation or diarrhea.    HISTORY: In 2018, she developed an episode of severe right sided facial pain radiating up the jaw to the temple lasting up to a few minutes.  No other associated focal symptoms.  Afterwards, she noticed speech difficulty.  It is not word-finding difficulty.  She knows what she wants to say but has trouble getting the words out.   She reports that it has gradually gotten worse.  She also has a dull persistent headache in her right temple.  No visual disturbance, facial droop, facial numbness. Slurred speech or unilateral numbness or weakness.  She reports no difficulty reading or understanding other people when they speak to her.  She sometimes notes difficulty writing because her hand may shake.  She reports some short-term memory difficulty such as misplacing items but nothing significant that affects her independence and daily living.  She reports history of anxiety.   MRI of brain from 07/12/2017 demonstrated scattered periventricular and subcortical T2 hyperintensities in the cerebral white matter bilaterally, consistent with chronic small vessel ischemic changes, but no acute intracranial abnormality.  Labs, such as sed rate, CRP, CBC, CMP and TSH, were unremarkable.  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  She has no difficulty with her job except she has to concentrate to talk with people.  She uses a Art therapist.   She reports history of migraines in young adulthood but not for many years.    PREVIOUS MEDICATIONS:   CURRENT MEDICATIONS:  Outpatient Encounter Medications as of 04/05/2022  Medication Sig   atorvastatin (LIPITOR) 10 MG tablet Take 10 mg by mouth daily.   Cyanocobalamin (B-12 PO) Take 5,000 mg by mouth daily.   donepezil (ARICEPT) 10 MG  tablet TAKE 1 TABLET BY MOUTH EVERYDAY AT BEDTIME   escitalopram (LEXAPRO) 10 MG tablet Take 10 mg by mouth daily.    memantine (NAMENDA) 5 MG tablet TAKE 1 TABLET (5 MG AT NIGHT) FOR 2 WEEKS, THEN INCREASE TO 1 TABLET (5 MG) TWICE A DAY   No facility-administered encounter medications on file as of 04/05/2022.       09/27/2021    1:00 PM  MMSE - Mini Mental State Exam  Orientation to time 1  Orientation to Place 5  Registration 3  Attention/ Calculation 3  Recall 0  Language- name 2 objects 2  Language- repeat 1  Language- follow 3 step command 3  Language- read & follow direction 1  Write a sentence 0  Copy design 0  Total score 19      04/24/2019    8:00 AM  Montreal Cognitive Assessment   Visuospatial/ Executive (0/5)  1  Naming (0/3) 1  Attention: Read list of digits (0/2) 1  Attention: Read list of letters (0/1) 1  Attention: Serial 7 subtraction starting at 100 (0/3) 0  Language: Repeat phrase (0/2) 2  Language : Fluency (0/1) 0  Abstraction (0/2) 1  Delayed Recall (0/5) 1  Orientation (0/6) 5  Total 13  Adjusted Score (based on education) 14    Objective:     PHYSICAL EXAMINATION:    VITALS:  There were no vitals filed for this visit.  GEN:  The patient appears stated age and is in NAD. HEENT:  Normocephalic, atraumatic.   Neurological examination:  General: NAD, well-groomed, appears stated age. Orientation: The patient is alert. Oriented to person, place and date Cranial nerves: There is good facial symmetry.The speech is fluent and clear. No aphasia or dysarthria. Fund of knowledge is appropriate. Recent and remote memory are impaired. Attention and concentration are reduced.  Able to name objects and repeat phrases.  Hearing is intact to conversational tone.    Sensation: Sensation is intact to light touch throughout Motor: Strength is at least antigravity x4. Tremors: none  DTR's 2/4 in UE/LE     Movement examination: Tone: There is normal tone  in the UE/LE Abnormal movements:  no tremor.  No myoclonus.  No asterixis.   Coordination:  There is no decremation with RAM's. Normal finger to nose  Gait and Station: The patient has no difficulty arising out of a deep-seated chair without the use of the hands. The patient's stride length is good.  Gait is cautious and narrow.    Thank you for allowing [place] the opportunity to participate in the care of this nice patient. Please do not hesitate to contact us for any questions or concerns.   Total time spent on today's visit was *** minutes dedicated to this patient today, preparing to see patient, examining the patient, ordering tests and/or medications and counseling the patient, documenting clinical information in the EHR or other health record, independently interpreting results and communicating results to the patient/family, discussing treatment and goals, answering patient's questions and coordinating care.  Cc:  Lavone Orn, MD  Sharene Butters 04/04/2022 10:24 AM

## 2022-04-05 ENCOUNTER — Ambulatory Visit: Payer: Medicare Other | Admitting: Physician Assistant

## 2022-04-05 VITALS — Resp 20 | Ht 64.0 in

## 2022-04-05 DIAGNOSIS — F0280 Dementia in other diseases classified elsewhere without behavioral disturbance: Secondary | ICD-10-CM

## 2022-04-05 DIAGNOSIS — G309 Alzheimer's disease, unspecified: Secondary | ICD-10-CM | POA: Diagnosis not present

## 2022-04-05 MED ORDER — MEMANTINE HCL 10 MG PO TABS: ORAL_TABLET | ORAL | 11 refills | Status: DC

## 2022-04-05 MED ORDER — DONEPEZIL HCL 10 MG PO TABS: ORAL_TABLET | ORAL | 0 refills | Status: DC

## 2022-04-05 NOTE — Patient Instructions (Addendum)
It was a pleasure to see you today at our office.   Recommendations:  Follow up in  6 months March 20 11:30  Continue donepezil 10 mg daily.   Increase  Memantine 10 mg twice daily.   Whom to call:  Memory  decline, memory medications: Call our office [phone]   For psychiatric meds, mood meds: Please have your primary care physician manage these medications.   Counseling regarding caregiver distress, including caregiver depression, anxiety and issues regarding community resources, adult day care programs, adult living facilities, or memory care questions:   Feel free to contact [place], Social Worker at [phone]   For assessment of decision of mental capacity and competency:  Call Dr. Anthoney Harada, geriatric psychiatrist at [phone]  For guidance in geriatric dementia issues please call [facility] [phone]  For guidance regarding [facility] and if placement were needed at the facility, contact Arnell Asal, Social Worker tel: [phone]  If you have any severe symptoms of a stroke, or other severe issues such as confusion,severe chills or fever, etc call 911 or go to the ER as you may need to be evaluated further   Feel free to visit Facebook page " [email or web address]" for tips of how to care for people with memory problems.      RECOMMENDATIONS FOR ALL PATIENTS WITH MEMORY PROBLEMS: 1. Continue to exercise (Recommend 30 minutes of walking everyday, or 3 hours every week) 2. Increase social interactions - continue going to [place] and enjoy social gatherings with friends and family 3. Eat healthy, avoid fried foods and eat more fruits and vegetables 4. Maintain adequate blood pressure, blood sugar, and blood cholesterol level. Reducing the risk of stroke and cardiovascular disease also helps promoting better memory. 5. Avoid stressful situations. Live a simple life and avoid aggravations. Organize your time and prepare for the  next day in anticipation. 6. Sleep well, avoid any interruptions of sleep and avoid any distractions in the bedroom that may interfere with adequate sleep quality 7. Avoid sugar, avoid sweets as there is a strong link between excessive sugar intake, diabetes, and cognitive impairment We discussed the Mediterranean diet, which has been shown to help patients reduce the risk of progressive memory disorders and reduces cardiovascular risk. This includes eating fish, eat fruits and green leafy vegetables, nuts like almonds and hazelnuts, walnuts, and also use olive oil. Avoid fast foods and fried foods as much as possible. Avoid sweets and sugar as sugar use has been linked to worsening of memory function.  There is always a concern of gradual progression of memory problems. If this is the case, then we may need to adjust level of care according to patient needs. Support, both to the patient and caregiver, should then be put into place.    The Alzheimer[employer] Association is here all day, every day for people facing Alzheimer's disease through our free 24/7 Helpline: [phone]. The Helpline provides reliable information and support to all those who need assistance, such as individuals living with memory loss, Alzheimer's or other dementia, caregivers, health care professionals and the public.  Our highly trained and knowledgeable staff can help you with: Understanding memory loss, dementia and Alzheimer's  Medications and other treatment options  General information about aging and brain health  Skills to provide quality care and to find the best care from professionals  Legal, financial and living-arrangement decisions Our Helpline also features: Confidential care consultation provided by master's level clinicians who can help with  decision-making support, crisis assistance and education on issues families face every day  Help in a caller's preferred language using our translation service that features  more than 200 languages and dialects  Referrals to local community programs, services and ongoing support     FALL PRECAUTIONS: Be cautious when walking. Scan the area for obstacles that may increase the risk of trips and falls. When getting up in the mornings, sit up at the edge of the bed for a few minutes before getting out of bed. Consider elevating the bed at the head end to avoid drop of blood pressure when getting up. Walk always in a well-lit room (use night lights in the walls). Avoid area rugs or power cords from appliances in the middle of the walkways. Use a walker or a cane if necessary and consider physical therapy for balance exercise. Get your eyesight checked regularly.  FINANCIAL OVERSIGHT: Supervision, especially oversight when making financial decisions or transactions is also recommended.  HOME SAFETY: Consider the safety of the kitchen when operating appliances like stoves, microwave oven, and blender. Consider having supervision and share cooking responsibilities until no longer able to participate in those. Accidents with firearms and other hazards in the house should be identified and addressed as well.   ABILITY TO BE LEFT ALONE: If patient is unable to contact 911 operator, consider using LifeLine, or when the need is there, arrange for someone to stay with patients. Smoking is a fire hazard, consider supervision or cessation. Risk of wandering should be assessed by caregiver and if detected at any point, supervision and safe proof recommendations should be instituted.  MEDICATION SUPERVISION: Inability to self-administer medication needs to be constantly addressed. Implement a mechanism to ensure safe administration of the medications.   DRIVING: Regarding driving, in patients with progressive memory problems, driving will be impaired. We advise to have someone else do the driving if trouble finding directions or if minor accidents are reported. Independent driving  assessment is available to determine safety of driving.   If you are interested in the driving assessment, you can contact the following:  [employer] in [place]  [address] [place] [phone] or [phone]      [place] refers to food and lifestyle choices that are based on the traditions of countries located on the [employer]. This way of eating has been shown to help prevent certain conditions and improve outcomes for people who have chronic diseases, like kidney disease and heart disease. What are tips for following this plan? Lifestyle  Cook and eat meals together with your family, when possible. Drink enough fluid to keep your urine clear or pale yellow. Be physically active every day. This includes: Aerobic exercise like running or swimming. Leisure activities like gardening, walking, or housework. Get 7-8 hours of sleep each night. If recommended by your health care provider, drink red wine in moderation. This means 1 glass a day for nonpregnant women and 2 glasses a day for men. A glass of wine equals 5 oz (150 mL). Reading food labels  Check the serving size of packaged foods. For foods such as rice and pasta, the serving size refers to the amount of cooked product, not dry. Check the total fat in packaged foods. Avoid foods that have saturated fat or trans fats. Check the ingredients list for added sugars, such as corn syrup. Shopping  At the grocery store, buy most of  your food from the areas near the walls of the store. This includes: Fresh fruits and vegetables (produce). Grains, beans, nuts, and seeds. Some of these may be available in unpackaged forms or large amounts (in bulk). Fresh seafood. Poultry and eggs. Low-fat dairy products. Buy whole ingredients instead of prepackaged foods. Buy fresh fruits and  vegetables in-season from local farmers markets. Buy frozen fruits and vegetables in resealable bags. If you do not have access to quality fresh seafood, buy precooked frozen shrimp or canned fish, such as tuna, salmon, or sardines. Buy small amounts of raw or cooked vegetables, salads, or olives from the deli or salad bar at your store. Stock your pantry so you always have certain foods on hand, such as olive oil, canned tuna, canned tomatoes, rice, pasta, and beans. Cooking  Cook foods with extra-virgin olive oil instead of using butter or other vegetable oils. Have meat as a side dish, and have vegetables or grains as your main dish. This means having meat in small portions or adding small amounts of meat to foods like pasta or stew. Use beans or vegetables instead of meat in common dishes like chili or lasagna. Experiment with different cooking methods. Try roasting or broiling vegetables instead of steaming or sauteing them. Add frozen vegetables to soups, stews, pasta, or rice. Add nuts or seeds for added healthy fat at each meal. You can add these to yogurt, salads, or vegetable dishes. Marinate fish or vegetables using olive oil, lemon juice, garlic, and fresh herbs. Meal planning  Plan to eat 1 vegetarian meal one day each week. Try to work up to 2 vegetarian meals, if possible. Eat seafood 2 or more times a week. Have healthy snacks readily available, such as: Vegetable sticks with hummus. Greek yogurt. Fruit and nut trail mix. Eat balanced meals throughout the week. This includes: Fruit: 2-3 servings a day Vegetables: 4-5 servings a day Low-fat dairy: 2 servings a day Fish, poultry, or lean meat: 1 serving a day Beans and legumes: 2 or more servings a week Nuts and seeds: 1-2 servings a day Whole grains: 6-8 servings a day Extra-virgin olive oil: 3-4 servings a day Limit red meat and sweets to only a few servings a month What are my food choices? Mediterranean  diet Recommended Grains: Whole-grain pasta. Brown rice. Bulgar wheat. Polenta. Couscous. Whole-wheat bread. [place]. Vegetables: Artichokes. Beets. Broccoli. Cabbage. Carrots. Eggplant. Green beans. Chard. Kale. Spinach. Onions. Leeks. Peas. Squash. Tomatoes. Peppers. Radishes. Fruits: Apples. Apricots. Avocado. Berries. Bananas. Cherries. Dates. Figs. Grapes. Lemons. Melon. Oranges. Peaches. Plums. Pomegranate. Meats and other protein foods: Beans. Almonds. Sunflower seeds. Pine nuts. Peanuts. [place]. Salmon. Scallops. Shrimp. [place]. Tilapia. Clams. Oysters. Eggs. Dairy: Low-fat milk. Cheese. Greek yogurt. Beverages: Water. Red wine. Herbal tea. Fats and oils: Extra virgin olive oil. Avocado oil. Grape seed oil. Sweets and desserts: [place] yogurt with honey. Baked apples. Poached pears. Trail mix. Seasoning and other foods: Basil. Cilantro. Coriander. Cumin. Mint. Parsley. Sage. Rosemary. Tarragon. Garlic. Oregano. Thyme. Pepper. Balsalmic vinegar. Tahini. Hummus. Tomato sauce. Olives. Mushrooms. Limit these Grains: Prepackaged pasta or rice dishes. Prepackaged cereal with added sugar. Vegetables: Deep fried potatoes (french fries). Fruits: Fruit canned in syrup. Meats and other protein foods: Beef. Pork. Lamb. Poultry with skin. Hot dogs. [place] Salines. Dairy: Ice cream. Sour cream. Whole milk. Beverages: Juice. Sugar-sweetened soft drinks. Beer. Liquor and spirits. Fats and oils: Butter. Canola oil. Vegetable oil. Beef fat (tallow). Lard. Sweets and desserts: Cookies. Cakes. Pies. Candy. Seasoning and other foods:  Mayonnaise. Premade sauces and marinades. The items listed may not be a complete list. Talk with your dietitian about what dietary choices are right for you. Summary The Mediterranean diet includes both food and lifestyle choices. Eat a variety of fresh fruits and vegetables, beans, nuts, seeds, and whole grains. Limit the amount of red meat and sweets that you eat. Talk with your  health care provider about whether it is safe for you to drink red wine in moderation. This means 1 glass a day for nonpregnant women and 2 glasses a day for men. A glass of wine equals 5 oz (150 mL). This information is not intended to replace advice given to you by your health care provider. Make sure you discuss any questions you have with your health care provider. Document Released: 02/24/2016 Document Revised: 03/28/2016 Document Reviewed: 02/24/2016 Elsevier Interactive Patient Education  2017 [employer].

## 2022-04-29 ENCOUNTER — Other Ambulatory Visit: Payer: Self-pay | Admitting: Physician Assistant

## 2022-07-31 ENCOUNTER — Other Ambulatory Visit: Payer: Self-pay | Admitting: Physician Assistant

## 2022-09-18 ENCOUNTER — Telehealth: Payer: Self-pay

## 2022-09-18 NOTE — Telephone Encounter (Signed)
Patients spouse called in asking for a call back from [place] about a bill. States he was speaking with her on Friday and missed her call.

## 2022-10-04 ENCOUNTER — Encounter: Payer: Self-pay | Admitting: Physician Assistant

## 2022-10-04 ENCOUNTER — Ambulatory Visit: Payer: Medicare Other | Admitting: Physician Assistant

## 2022-10-04 VITALS — BP 143/63 | HR 85 | Resp 18 | Ht 64.0 in | Wt 153.0 lb

## 2022-10-04 DIAGNOSIS — G309 Alzheimer's disease, unspecified: Secondary | ICD-10-CM | POA: Diagnosis not present

## 2022-10-04 DIAGNOSIS — F0280 Dementia in other diseases classified elsewhere without behavioral disturbance: Secondary | ICD-10-CM | POA: Diagnosis not present

## 2022-10-04 MED ORDER — DONEPEZIL HCL 23 MG PO TABS: ORAL_TABLET | ORAL | 11 refills | Status: DC

## 2022-10-04 NOTE — Patient Instructions (Addendum)
It was a pleasure to see you today at our office.   Recommendations:  Follow up in  6 months Sept 19 at 11:30  Continue donepezil but increase to 23 mg daily when you finish the bottle  Increase  Memantine 10 mg twice daily.  Consider talking with the primary about restarting low dose Lexapro   Whom to call:  Memory  decline, memory medications: Call our office [phone]   For psychiatric meds, mood meds: Please have your primary care physician manage these medications.     For assessment of decision of mental capacity and competency:  Call Dr. Anthoney Harada, geriatric psychiatrist at [phone]  For guidance in geriatric dementia issues please call [facility] [phone]  For guidance regarding [facility] and if placement were needed at the facility, contact Arnell Asal, Social Worker tel: [phone]  If you have any severe symptoms of a stroke, or other severe issues such as confusion,severe chills or fever, etc call 911 or go to the ER as you may need to be evaluated further   Feel free to visit Facebook page " [email or web address]" for tips of how to care for people with memory problems.      RECOMMENDATIONS FOR ALL PATIENTS WITH MEMORY PROBLEMS: 1. Continue to exercise (Recommend 30 minutes of walking everyday, or 3 hours every week) 2. Increase social interactions - continue going to [place] and enjoy social gatherings with friends and family 3. Eat healthy, avoid fried foods and eat more fruits and vegetables 4. Maintain adequate blood pressure, blood sugar, and blood cholesterol level. Reducing the risk of stroke and cardiovascular disease also helps promoting better memory. 5. Avoid stressful situations. Live a simple life and avoid aggravations. Organize your time and prepare for the next day in anticipation. 6. Sleep well, avoid any interruptions of sleep and avoid any distractions in the bedroom that may interfere with adequate sleep  quality 7. Avoid sugar, avoid sweets as there is a strong link between excessive sugar intake, diabetes, and cognitive impairment We discussed the Mediterranean diet, which has been shown to help patients reduce the risk of progressive memory disorders and reduces cardiovascular risk. This includes eating fish, eat fruits and green leafy vegetables, nuts like almonds and hazelnuts, walnuts, and also use olive oil. Avoid fast foods and fried foods as much as possible. Avoid sweets and sugar as sugar use has been linked to worsening of memory function.  There is always a concern of gradual progression of memory problems. If this is the case, then we may need to adjust level of care according to patient needs. Support, both to the patient and caregiver, should then be put into place.    The Alzheimer's Association is here all day, every day for people facing Alzheimer's disease through our free 24/7 Helpline: [phone]. The Helpline provides reliable information and support to all those who need assistance, such as individuals living with memory loss, Alzheimer's or other dementia, caregivers, health care professionals and the public.  Our highly trained and knowledgeable staff can help you with: Understanding memory loss, dementia and Alzheimer's  Medications and other treatment options  General information about aging and brain health  Skills to provide quality care and to find the best care from professionals  Legal, financial and living-arrangement decisions Our Helpline also features: Confidential care consultation provided by master's level clinicians who can help with decision-making support, crisis assistance and education on issues families face every day  Help in a caller's preferred  language using our translation service that features more than 200 languages and dialects  Referrals to local community programs, services and ongoing support     FALL PRECAUTIONS: Be cautious when  walking. Scan the area for obstacles that may increase the risk of trips and falls. When getting up in the mornings, sit up at the edge of the bed for a few minutes before getting out of bed. Consider elevating the bed at the head end to avoid drop of blood pressure when getting up. Walk always in a well-lit room (use night lights in the walls). Avoid area rugs or power cords from appliances in the middle of the walkways. Use a walker or a cane if necessary and consider physical therapy for balance exercise. Get your eyesight checked regularly.  FINANCIAL OVERSIGHT: Supervision, especially oversight when making financial decisions or transactions is also recommended.  HOME SAFETY: Consider the safety of the kitchen when operating appliances like stoves, microwave oven, and blender. Consider having supervision and share cooking responsibilities until no longer able to participate in those. Accidents with firearms and other hazards in the house should be identified and addressed as well.   ABILITY TO BE LEFT ALONE: If patient is unable to contact 911 operator, consider using LifeLine, or when the need is there, arrange for someone to stay with patients. Smoking is a fire hazard, consider supervision or cessation. Risk of wandering should be assessed by caregiver and if detected at any point, supervision and safe proof recommendations should be instituted.  MEDICATION SUPERVISION: Inability to self-administer medication needs to be constantly addressed. Implement a mechanism to ensure safe administration of the medications.   DRIVING: Regarding driving, in patients with progressive memory problems, driving will be impaired. We advise to have someone else do the driving if trouble finding directions or if minor accidents are reported. Independent driving assessment is available to determine safety of driving.   If you are interested in the driving assessment, you can contact the following:  The  [employer] in [place]  [place] [phone] or [phone]      [place] refers to food and lifestyle choices that are based on the traditions of countries located on the [employer]. This way of eating has been shown to help prevent certain conditions and improve outcomes for people who have chronic diseases, like kidney disease and heart disease. What are tips for following this plan? Lifestyle  Cook and eat meals together with your family, when possible. Drink enough fluid to keep your urine clear or pale yellow. Be physically active every day. This includes: Aerobic exercise like running or swimming. Leisure activities like gardening, walking, or housework. Get 7-8 hours of sleep each night. If recommended by your health care provider, drink red wine in moderation. This means 1 glass a day for nonpregnant women and 2 glasses a day for men. A glass of wine equals 5 oz (150 mL). Reading food labels  Check the serving size of packaged foods. For foods such as rice and pasta, the serving size refers to the amount of cooked product, not dry. Check the total fat in packaged foods. Avoid foods that have saturated fat or trans fats. Check the ingredients list for added sugars, such as corn syrup. Shopping  At the grocery store, buy most of your food from the areas near the walls of the store. This includes: Fresh fruits and vegetables (produce).  Grains, beans, nuts, and seeds. Some of these may be available in unpackaged forms or large amounts (in bulk). Fresh seafood. Poultry and eggs. Low-fat dairy products. Buy whole ingredients instead of prepackaged foods. Buy fresh fruits and vegetables in-season from local farmers markets. Buy frozen fruits and vegetables in resealable bags. If you do not have access to quality fresh seafood,  buy precooked frozen shrimp or canned fish, such as tuna, salmon, or sardines. Buy small amounts of raw or cooked vegetables, salads, or olives from the deli or salad bar at your store. Stock your pantry so you always have certain foods on hand, such as olive oil, canned tuna, canned tomatoes, rice, pasta, and beans. Cooking  Cook foods with extra-virgin olive oil instead of using butter or other vegetable oils. Have meat as a side dish, and have vegetables or grains as your main dish. This means having meat in small portions or adding small amounts of meat to foods like pasta or stew. Use beans or vegetables instead of meat in common dishes like chili or lasagna. Experiment with different cooking methods. Try roasting or broiling vegetables instead of steaming or sauteing them. Add frozen vegetables to soups, stews, pasta, or rice. Add nuts or seeds for added healthy fat at each meal. You can add these to yogurt, salads, or vegetable dishes. Marinate fish or vegetables using olive oil, lemon juice, garlic, and fresh herbs. Meal planning  Plan to eat 1 vegetarian meal one day each week. Try to work up to 2 vegetarian meals, if possible. Eat seafood 2 or more times a week. Have healthy snacks readily available, such as: Vegetable sticks with hummus. Greek yogurt. Fruit and nut trail mix. Eat balanced meals throughout the week. This includes: Fruit: 2-3 servings a day Vegetables: 4-5 servings a day Low-fat dairy: 2 servings a day Fish, poultry, or lean meat: 1 serving a day Beans and legumes: 2 or more servings a week Nuts and seeds: 1-2 servings a day Whole grains: 6-8 servings a day Extra-virgin olive oil: 3-4 servings a day Limit red meat and sweets to only a few servings a month What are my food choices? Mediterranean diet Recommended Grains: Whole-grain pasta. Brown rice. Bulgar wheat. Polenta. Couscous. Whole-wheat bread. [place]. Vegetables: Artichokes. Beets. Broccoli.  Cabbage. Carrots. Eggplant. Green beans. Chard. Kale. Spinach. Onions. Leeks. Peas. Squash. Tomatoes. Peppers. Radishes. Fruits: Apples. Apricots. Avocado. Berries. Bananas. Cherries. Dates. Figs. Grapes. Lemons. Melon. Oranges. Peaches. Plums. Pomegranate. Meats and other protein foods: Beans. Almonds. Sunflower seeds. Pine nuts. Peanuts. [place]. Salmon. Scallops. Shrimp. [place]. Tilapia. Clams. Oysters. Eggs. Dairy: Low-fat milk. Cheese. Greek yogurt. Beverages: Water. Red wine. Herbal tea. Fats and oils: Extra virgin olive oil. Avocado oil. Grape seed oil. Sweets and desserts: [place] yogurt with honey. Baked apples. Poached pears. Trail mix. Seasoning and other foods: Basil. Cilantro. Coriander. Cumin. Mint. Parsley. Sage. Rosemary. Tarragon. Garlic. Oregano. Thyme. Pepper. Balsalmic vinegar. Tahini. Hummus. Tomato sauce. Olives. Mushrooms. Limit these Grains: Prepackaged pasta or rice dishes. Prepackaged cereal with added sugar. Vegetables: Deep fried potatoes (french fries). Fruits: Fruit canned in syrup. Meats and other protein foods: Beef. Pork. Lamb. Poultry with skin. Hot dogs. [place] Salines. Dairy: Ice cream. Sour cream. Whole milk. Beverages: Juice. Sugar-sweetened soft drinks. Beer. Liquor and spirits. Fats and oils: Butter. Canola oil. Vegetable oil. Beef fat (tallow). Lard. Sweets and desserts: Cookies. Cakes. Pies. Candy. Seasoning and other foods: Mayonnaise. Premade sauces and marinades. The items listed may not be a complete list. Talk with your dietitian  about what dietary choices are right for you. Summary The Mediterranean diet includes both food and lifestyle choices. Eat a variety of fresh fruits and vegetables, beans, nuts, seeds, and whole grains. Limit the amount of red meat and sweets that you eat. Talk with your health care provider about whether it is safe for you to drink red wine in moderation. This means 1 glass a day for nonpregnant women and 2 glasses a day for men. A glass  of wine equals 5 oz (150 mL). This information is not intended to replace advice given to you by your health care provider. Make sure you discuss any questions you have with your health care provider. Document Released: 02/24/2016 Document Revised: 03/28/2016 Document Reviewed: 02/24/2016 Elsevier Interactive Patient Education  2017 [employer].

## 2022-10-04 NOTE — Progress Notes (Signed)
Assessment/Plan:   Memory Impairment  Courtney Rangel is a very pleasant 82 y.o. RH female with a history of dementia likely due to Alzheimer's disease per neurocognitive testing on September 2022 seen today in follow up for memory loss. Patient is currently on donepezil 10 mg nightly and memantine 10 mg twice daily, tolerating well. MRI brain personally reviewed was remarkable for stable, mild chronic small vessel ischemic changes, without acute intracranial abnormality.  MMSE today is 14/30, and in today's visit, some cognitive decline is noted.  We discussed with her daughters increasing donepezil to 23 mg daily and monitor for any side effects, who agreed to try. In addition, discussed with daughter resuming low-dose of Lexapro for mood control, and will reach out to their PCP.  Patient is still able to perform her activities of daily living and drive short distances.      Follow up in 6 months. Continue donepezil, increase it to 23 mg daily, side effects discussed Continue memantine 10 mg twice daily, side effects discussed Continue to control mood as per PCP Continue to control cardiovascular risk factors    Subjective:    This patient is accompanied in the office by her 2 daughters who supplement the history.  Previous records as well as any outside records available were reviewed prior to todays visit. Patient was last seen on 04/07/2022 at which time her MMSE was 17/30    Any changes in memory since last visit?  Patient denies any significant changes in her memory, her daughters however, noticed that she may forget conversations within a day, and at times she has difficulties finding a word.  Daughters report that her short-term may be worse than her long-term memory although the latter is affected as well.  She admits to not having been doing any brain games.  She likes to watch TV with her dog on her lap.  She is not as social as she used to be, as her friends live far away, may  see them twice a month at most.  repeats oneself?  Endorsed by her daughters   Leaving objects in unusual places?    denies   Wandering behavior?  denies   Any personality changes since last visit?  She may experience triggers from the past and might get angry at her husband.  Any worsening depression?:  She may have moments in which she may feel down in view of her memory changes, but no formal diagnosis of depression was made. Hallucinations or paranoia?  Patient is not aware of it, however her daughter reports that on one occasion she had seen a show on TV, and she was trying to wipe the blood of the actor's face,.  1 time she thought that her daughter Courtney Rangel was there although she was not. Seizures?    denies    Any sleep changes?  She may have some vivid dreams that may feel real.  Denies REM behavior or sleepwalking   Sleep apnea?   denies   Any hygiene concerns?    denies   Independent of bathing and dressing?  Endorsed  Does the patient needs help with medications?  Husband supervises, fixes the pillbox Who is in charge of the finances?  Patient pays the bills, her husband supervises  Any changes in appetite?  Husband makes sure that she eats.  Patient have trouble swallowing?  denies   Does the patient cook? No    Any headaches?   denies   Chronic back pain  denies   Ambulates with difficulty?  "She had a muscle pulled and for a week was debilitating, needing a heating pad, she does not remember this event "  Recent falls or head injuries? denies     Unilateral weakness, numbness or tingling?    denies   Any tremors?  denies   Any anosmia?  Patient denies   Any incontinence of urine?  denies   Any bowel dysfunction?     denies      Patient lives with her husband  Does the patient drive? Only drives short distances "passed the test in September 2022 ". She needs specific directions-daughter says      HISTORY: In 2018, she developed an episode of severe right sided facial pain  radiating up the jaw to the temple lasting up to a few minutes.  No other associated focal symptoms.  Afterwards, she noticed speech difficulty.  It is not word-finding difficulty.  She knows what she wants to say but has trouble getting the words out.   She reports that it has gradually gotten worse.  She also has a dull persistent headache in her right temple.  No visual disturbance, facial droop, facial numbness. Slurred speech or unilateral numbness or weakness.  She reports no difficulty reading or understanding other people when they speak to her.  She sometimes notes difficulty writing because her hand may shake.  She reports some short-term memory difficulty such as misplacing items but nothing significant that affects her independence and daily living.  She reports history of anxiety.   MRI of brain from 07/12/2017 demonstrated scattered periventricular and subcortical T2 hyperintensities in the cerebral white matter bilaterally, consistent with chronic small vessel ischemic changes, but no acute intracranial abnormality.  Labs, such as sed rate, CRP, CBC, CMP and TSH, were unremarkable.  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  She has no difficulty with her job except she has to concentrate to talk with people.  She uses a Art therapist.   She reports history of migraines in young adulthood but not for many years.   PREVIOUS MEDICATIONS:   CURRENT MEDICATIONS:  Outpatient Encounter Medications as of 10/04/2022  Medication Sig   atorvastatin (LIPITOR) 10 MG tablet Take 10 mg by mouth daily.   Cyanocobalamin (B-12 PO) Take 5,000 mg by mouth daily.   memantine (NAMENDA) 10 MG tablet TAKE 1 TABLET BY MOUTH TWICE A DAY   [DISCONTINUED] donepezil (ARICEPT) 10 MG tablet TAKE 1 TABLET BY MOUTH EVERYDAY AT BEDTIME   donepezil (ARICEPT) 23 MG TABS tablet Take one tab daily   escitalopram  (LEXAPRO) 10 MG tablet Take 10 mg by mouth daily.  (Patient not taking: Reported on 04/05/2022)   No facility-administered encounter medications on file as of 10/04/2022.       10/04/2022   12:00 PM 04/07/2022   10:24 AM 09/27/2021    1:00 PM  MMSE - Mini Mental State Exam  Orientation to time 2 3 1   Orientation to Place 2 3 5   Registration 3 3 3   Attention/ Calculation 0 2 3  Recall 0 0 0  Language- name 2 objects 2 1 2   Language- repeat 1 1 1   Language- follow 3 step command 3 3 3   Language- read & follow direction 1 1 1   Write a sentence 0 0 0  Copy design 0 0 0  Total score 14 17  19      04/24/2019    8:00 AM  Montreal Cognitive Assessment   Visuospatial/ Executive (0/5) 1  Naming (0/3) 1  Attention: Read list of digits (0/2) 1  Attention: Read list of letters (0/1) 1  Attention: Serial 7 subtraction starting at 100 (0/3) 0  Language: Repeat phrase (0/2) 2  Language : Fluency (0/1) 0  Abstraction (0/2) 1  Delayed Recall (0/5) 1  Orientation (0/6) 5  Total 13  Adjusted Score (based on education) 14    Objective:     PHYSICAL EXAMINATION:    VITALS:   Vitals:   10/04/22 1133  BP: (!) 143/63  Pulse: 85  Resp: 18  SpO2: 99%  Weight: 153 lb (69.4 kg)  Height: 5\' 4"  (1.626 m)    GEN:  The patient appears stated age and is in NAD. HEENT:  Normocephalic, atraumatic.   Neurological examination:  General: NAD, well-groomed, appears stated age. Orientation: The patient is alert. Oriented to person, not to place or date Cranial nerves: There is good facial symmetry.The speech is fluent and clear, needs some time to retrieve information. No aphasia or dysarthria. Fund of knowledge is reduced. Recent and remote memory are impaired. Attention and concentration are reduced.  Able to name objects and repeat phrases.  Hearing is intact to conversational tone.   Sensation: Sensation is intact to light touch throughout Motor: Strength is at least antigravity x4. DTR's  2/4 in UE/LE     Movement examination: Tone: There is normal tone in the UE/LE Abnormal movements:  no tremor.  No myoclonus.  No asterixis.   Coordination:  There is no decremation with RAM's. Normal finger to nose  Gait and Station: The patient has no difficulty arising out of a deep-seated chair without the use of the hands. The patient's stride length is good.  Gait is cautious and narrow.    Thank you for allowing [place] the opportunity to participate in the care of this nice patient. Please do not hesitate to contact us for any questions or concerns.   Total time spent on today's visit was 50  minutes dedicated to this patient today, preparing to see patient, examining the patient, ordering tests and/or medications and counseling the patient, documenting clinical information in the EHR or other health record, independently interpreting results and communicating results to the patient/family, discussing treatment and goals, answering patient's questions and coordinating care.  Cc:  Lavone Orn, MD  Sharene Butters 10/04/2022 12:48 PM

## 2023-04-05 ENCOUNTER — Ambulatory Visit: Payer: Medicare Other | Admitting: Physician Assistant

## 2023-04-05 ENCOUNTER — Encounter: Payer: Self-pay | Admitting: Physician Assistant

## 2023-04-05 ENCOUNTER — Telehealth: Payer: Self-pay | Admitting: Physician Assistant

## 2023-04-05 VITALS — BP 127/74 | HR 69 | Resp 20 | Ht 64.0 in | Wt 147.0 lb

## 2023-04-05 DIAGNOSIS — F0280 Dementia in other diseases classified elsewhere without behavioral disturbance: Secondary | ICD-10-CM | POA: Diagnosis not present

## 2023-04-05 DIAGNOSIS — G309 Alzheimer's disease, unspecified: Secondary | ICD-10-CM

## 2023-04-05 NOTE — Progress Notes (Addendum)
Assessment/Plan:   Dementia likely due to Alzheimer's disease  Courtney Rangel is a delightful 82 y.o. RH female with a history of dementia due to Alzheimer's disease as per neurocognitive testing September 2022 seen today in follow up for memory loss. Patient is currently on donepezil 23 mg nightly and memantine 10 mg twice daily, tolerating well.  Cognitive decline is noted today, with MMSE 13/30.  She does have difficulty with short and long-term memory.  She is able to participate on her ADLs, and drives only to very familiar places.  Mood is better per husband's report.    Follow up in 6 months. Continue donepezil 23 mg daily and memantine 10 mg twice daily, side effects discussed Recommend good control of her cardiovascular risk factors Continue to control mood as per PCP.  She is on Lexapro per PCP. Monitor driving     Subjective:    This patient is accompanied in the office by her husband who supplements the history.  Previous records as well as any outside records available were reviewed prior to todays visit. Patient was last seen on 10/11/2022 with MMSE of 14/30.    Any changes in memory since last visit? "Not as good"-husband says. She stays busy.  She forgets recent conversations, has difficulty finding a word.  In today's visit as she could not remember the restaurant she went to as of yesterday.  Both short and long-term memory are affected.  She enjoys watching TV with her double her lab.  She is more social than before, spending more time with her husband  repeats oneself?  Endorsed  especially with appointments  Disoriented when walking into a room?  Patient denies    Leaving objects in unusual places?  May misplace things but not in unusual places   Wandering behavior?  denies   Any personality changes since last visit?  Denies. Any worsening depression?:  Denies.  Hallucinations or paranoia?  Denies.   Seizures? denies    Any sleep changes?  Denies vivid dreams,  REM behavior or sleepwalking  She quit taking benadryl and sleeps better . Sleep apnea?   Denies.   Any hygiene concerns? Denies.  Independent of bathing and dressing?  Endorsed  Does the patient needs help with medications?  Her husband supervises, he thinks is the pillbox  Who is in charge of the finances? Husband  is in charge     Any changes in appetite? She eats well. Husband makes sure that she eats.    Patient have trouble swallowing? Denies.   Does the patient cook? No Any headaches?   denies   Chronic back pain  denies   Ambulates with difficulty? Denies.    Recent falls or head injuries? denies  Any vision changes?  The patient had an apparent allergic reaction possibly environmental, versus insect bites.  He is affected both eyelids, she is to see the doctor immediately after leaving this office.    Unilateral weakness, numbness or tingling? Denies.   Any tremors?  Denies   Any anosmia?  Denies   Any incontinence of urine? Denies   Any bowel dysfunction?   Denies      Patient lives with husband     Does the patient drive?  She only drives short and familiar places    MRI brain personally reviewed was remarkable for stable, mild chronic small vessel ischemic changes, without acute intracranial abnormality.      HISTORY: In 2018, she developed an episode of severe  right sided facial pain radiating up the jaw to the temple lasting up to a few minutes.  No other associated focal symptoms.  Afterwards, she noticed speech difficulty.  It is not word-finding difficulty.  She knows what she wants to say but has trouble getting the words out.   She reports that it has gradually gotten worse.  She also has a dull persistent headache in her right temple.  No visual disturbance, facial droop, facial numbness. Slurred speech or unilateral numbness or weakness.  She reports no difficulty reading or understanding other people when they speak to her.  She sometimes notes difficulty writing  because her hand may shake.  She reports some short-term memory difficulty such as misplacing items but nothing significant that affects her independence and daily living.  She reports history of anxiety.   MRI of brain from 07/12/2017 demonstrated scattered periventricular and subcortical T2 hyperintensities in the cerebral white matter bilaterally, consistent with chronic small vessel ischemic changes, but no acute intracranial abnormality.  Labs, such as sed rate, CRP, CBC, CMP and TSH, were unremarkable.  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  She has no difficulty with her job except she has to concentrate to talk with people.  She uses a Print production planner.   She reports history of migraines in young adulthood but not for many years.    PREVIOUS MEDICATIONS:   CURRENT MEDICATIONS:  Outpatient Encounter Medications as of 04/05/2023  Medication Sig   atorvastatin (LIPITOR) 10 MG tablet Take 10 mg by mouth daily.   Cyanocobalamin (B-12 PO) Take 5,000 mg by mouth daily.   donepezil (ARICEPT) 23 MG TABS tablet Take one tab daily   escitalopram (LEXAPRO) 10 MG tablet Take 10 mg by mouth daily.   memantine (NAMENDA) 10 MG tablet TAKE 1 TABLET BY MOUTH TWICE A DAY   No facility-administered encounter medications on file as of 04/05/2023.       04/05/2023   12:00 PM 10/04/2022   12:00 PM 04/07/2022   10:24 AM  MMSE - Mini Mental State Exam  Orientation to time 0 2 3  Orientation to Place 1 2 3   Registration 3 3 3   Attention/ Calculation 2 0 2  Recall 0 0 0  Language- name 2 objects 2 2 1   Language- repeat 1 1 1   Language- follow 3 step command 3 3 3   Language- read & follow direction 1 1 1   Write a sentence 0 0 0  Copy design 0 0 0  Total score 13 14 17       04/24/2019    8:00 AM  Montreal Cognitive Assessment   Visuospatial/ Executive (0/5) 1  Naming (0/3) 1  Attention: Read  list of digits (0/2) 1  Attention: Read list of letters (0/1) 1  Attention: Serial 7 subtraction starting at 100 (0/3) 0  Language: Repeat phrase (0/2) 2  Language : Fluency (0/1) 0  Abstraction (0/2) 1  Delayed Recall (0/5) 1  Orientation (0/6) 5  Total 13  Adjusted Score (based on education) 14    Objective:     PHYSICAL EXAMINATION:    VITALS:   Vitals:   04/05/23 1118  BP: 127/74  Pulse: 69  Resp: 20  SpO2: 95%  Weight: 147 lb (66.7 kg)  Height: 5\' 4"  (1.626 m)    GEN:  The patient appears stated age and is in NAD. HEENT:  Normocephalic, atraumatic.   Neurological examination:  General: NAD, well-groomed, appears stated age. Orientation: The patient is alert. Oriented to person, not to place or date  cranial nerves: There is good facial symmetry both upper and lower eyelids are with some swelling, but there is no erythema or purulent material noted..The speech is fluent and clear. No aphasia or dysarthria. Fund of knowledge is reduced.  Recent and remote memory are impaired. Attention and concentration are reduced.  Able to name objects and repeat phrases.  Hearing is intact to conversational tone.   Sensation: Sensation is intact to light touch throughout Motor: Strength is at least antigravity x4. DTR's 2/4 in UE/LE     Movement examination: Tone: There is normal tone in the UE/LE Abnormal movements:  no tremor.  No myoclonus.  No asterixis.   Coordination:  There is no decremation with RAM's. Normal finger to nose  Gait and Station: The patient has no difficulty arising out of a deep-seated chair without the use of the hands. The patient's stride length is good.  Gait is cautious and narrow.    Thank you for allowing [place] the opportunity to participate in the care of this nice patient. Please do not hesitate to contact us for any questions or concerns.   Total time spent on today's visit was 35 minutes dedicated to this patient today, preparing to see patient,  examining the patient, ordering tests and/or medications and counseling the patient, documenting clinical information in the EHR or other health record, independently interpreting results and communicating results to the patient/family, discussing treatment and goals, answering patient's questions and coordinating care.  Cc:  Kirby Funk, MD (Inactive)  Marlowe Kays 04/05/2023 12:25 PM

## 2023-04-05 NOTE — Patient Instructions (Signed)
It was a pleasure to see you today at our office.   Recommendations:  Follow up in  6 months  March 19 at 11:30  Continue donepezil   23 mg daily  Increase Memantine 10 mg twice daily.   Visit "[facility]"  Whom to call:  Memory  decline, memory medications: Call our office [phone]   For psychiatric meds, mood meds: Please have your primary care physician manage these medications.     For assessment of decision of mental capacity and competency:  Call Dr. Erick Blinks, geriatric psychiatrist at [phone]  For guidance in geriatric dementia issues please call [facility] [phone]  For guidance regarding [facility] and if placement were needed at the facility, contact Sidney Ace, Social Worker tel: [phone]  If you have any severe symptoms of a stroke, or other severe issues such as confusion,severe chills or fever, etc call 911 or go to the ER as you may need to be evaluated further   Feel free to visit Facebook page " [email or web address]" for tips of how to care for people with memory problems.      RECOMMENDATIONS FOR ALL PATIENTS WITH MEMORY PROBLEMS: 1. Continue to exercise (Recommend 30 minutes of walking everyday, or 3 hours every week) 2. Increase social interactions - continue going to [place] and enjoy social gatherings with friends and family 3. Eat healthy, avoid fried foods and eat more fruits and vegetables 4. Maintain adequate blood pressure, blood sugar, and blood cholesterol level. Reducing the risk of stroke and cardiovascular disease also helps promoting better memory. 5. Avoid stressful situations. Live a simple life and avoid aggravations. Organize your time and prepare for the next day in anticipation. 6. Sleep well, avoid any interruptions of sleep and avoid any distractions in the bedroom that may interfere with adequate sleep quality 7. Avoid sugar, avoid sweets as there is a strong link between excessive  sugar intake, diabetes, and cognitive impairment We discussed the Mediterranean diet, which has been shown to help patients reduce the risk of progressive memory disorders and reduces cardiovascular risk. This includes eating fish, eat fruits and green leafy vegetables, nuts like almonds and hazelnuts, walnuts, and also use olive oil. Avoid fast foods and fried foods as much as possible. Avoid sweets and sugar as sugar use has been linked to worsening of memory function.  There is always a concern of gradual progression of memory problems. If this is the case, then we may need to adjust level of care according to patient needs. Support, both to the patient and caregiver, should then be put into place.    The Alzheimer's Association is here all day, every day for people facing Alzheimer's disease through our free 24/7 Helpline: [phone]. The Helpline provides reliable information and support to all those who need assistance, such as individuals living with memory loss, Alzheimer's or other dementia, caregivers, health care professionals and the public.  Our highly trained and knowledgeable staff can help you with: Understanding memory loss, dementia and Alzheimer's  Medications and other treatment options  General information about aging and brain health  Skills to provide quality care and to find the best care from professionals  Legal, financial and living-arrangement decisions Our Helpline also features: Confidential care consultation provided by master's level clinicians who can help with decision-making support, crisis assistance and education on issues families face every day  Help in a caller's preferred language using our translation service that features more than 200 languages and  dialects  Referrals to local community programs, services and ongoing support     FALL PRECAUTIONS: Be cautious when walking. Scan the area for obstacles that may increase the risk of trips and falls. When  getting up in the mornings, sit up at the edge of the bed for a few minutes before getting out of bed. Consider elevating the bed at the head end to avoid drop of blood pressure when getting up. Walk always in a well-lit room (use night lights in the walls). Avoid area rugs or power cords from appliances in the middle of the walkways. Use a walker or a cane if necessary and consider physical therapy for balance exercise. Get your eyesight checked regularly.  FINANCIAL OVERSIGHT: Supervision, especially oversight when making financial decisions or transactions is also recommended.  HOME SAFETY: Consider the safety of the kitchen when operating appliances like stoves, microwave oven, and blender. Consider having supervision and share cooking responsibilities until no longer able to participate in those. Accidents with firearms and other hazards in the house should be identified and addressed as well.   ABILITY TO BE LEFT ALONE: If patient is unable to contact 911 operator, consider using LifeLine, or when the need is there, arrange for someone to stay with patients. Smoking is a fire hazard, consider supervision or cessation. Risk of wandering should be assessed by caregiver and if detected at any point, supervision and safe proof recommendations should be instituted.  MEDICATION SUPERVISION: Inability to self-administer medication needs to be constantly addressed. Implement a mechanism to ensure safe administration of the medications.   DRIVING: Regarding driving, in patients with progressive memory problems, driving will be impaired. We advise to have someone else do the driving if trouble finding directions or if minor accidents are reported. Independent driving assessment is available to determine safety of driving.   If you are interested in the driving assessment, you can contact the following:  [employer] in [place] [phone]  [facility]  [phone]  [facility] [phone] or [phone]      Mediterranean Diet A Mediterranean diet refers to food and lifestyle choices that are based on the traditions of countries located on the [employer]. This way of eating has been shown to help prevent certain conditions and improve outcomes for people who have chronic diseases, like kidney disease and heart disease. What are tips for following this plan? Lifestyle  Cook and eat meals together with your family, when possible. Drink enough fluid to keep your urine clear or pale yellow. Be physically active every day. This includes: Aerobic exercise like running or swimming. Leisure activities like gardening, walking, or housework. Get 7-8 hours of sleep each night. If recommended by your health care provider, drink red wine in moderation. This means 1 glass a day for nonpregnant women and 2 glasses a day for men. A glass of wine equals 5 oz (150 mL). Reading food labels  Check the serving size of packaged foods. For foods such as rice and pasta, the serving size refers to the amount of cooked product, not dry. Check the total fat in packaged foods. Avoid foods that have saturated fat or trans fats. Check the ingredients list for added sugars, such as corn syrup. Shopping  At the grocery store, buy most of your food from the areas near the walls of the store. This includes: Fresh fruits and vegetables (produce). Grains, beans, nuts, and seeds. Some of these may be available in  unpackaged forms or large amounts (in bulk). Fresh seafood. Poultry and eggs. Low-fat dairy products. Buy whole ingredients instead of prepackaged foods. Buy fresh fruits and vegetables in-season from local farmers markets. Buy frozen fruits and vegetables in resealable bags. If you do not have access to quality fresh seafood, buy precooked frozen shrimp or canned fish, such as tuna, salmon, or sardines. Buy  small amounts of raw or cooked vegetables, salads, or olives from the deli or salad bar at your store. Stock your pantry so you always have certain foods on hand, such as olive oil, canned tuna, canned tomatoes, rice, pasta, and beans. Cooking  Cook foods with extra-virgin olive oil instead of using butter or other vegetable oils. Have meat as a side dish, and have vegetables or grains as your main dish. This means having meat in small portions or adding small amounts of meat to foods like pasta or stew. Use beans or vegetables instead of meat in common dishes like chili or lasagna. Experiment with different cooking methods. Try roasting or broiling vegetables instead of steaming or sauteing them. Add frozen vegetables to soups, stews, pasta, or rice. Add nuts or seeds for added healthy fat at each meal. You can add these to yogurt, salads, or vegetable dishes. Marinate fish or vegetables using olive oil, lemon juice, garlic, and fresh herbs. Meal planning  Plan to eat 1 vegetarian meal one day each week. Try to work up to 2 vegetarian meals, if possible. Eat seafood 2 or more times a week. Have healthy snacks readily available, such as: Vegetable sticks with hummus. Greek yogurt. Fruit and nut trail mix. Eat balanced meals throughout the week. This includes: Fruit: 2-3 servings a day Vegetables: 4-5 servings a day Low-fat dairy: 2 servings a day Fish, poultry, or lean meat: 1 serving a day Beans and legumes: 2 or more servings a week Nuts and seeds: 1-2 servings a day Whole grains: 6-8 servings a day Extra-virgin olive oil: 3-4 servings a day Limit red meat and sweets to only a few servings a month What are my food choices? Mediterranean diet Recommended Grains: Whole-grain pasta. Brown rice. Bulgar wheat. Polenta. Couscous. Whole-wheat bread. Orpah Cobb. Vegetables: Artichokes. Beets. Broccoli. Cabbage. Carrots. Eggplant. Green beans. Chard. Kale. Spinach. Onions. Leeks. Peas.  Squash. Tomatoes. Peppers. Radishes. Fruits: Apples. Apricots. Avocado. Berries. Bananas. Cherries. Dates. Figs. Grapes. Lemons. Melon. Oranges. Peaches. Plums. Pomegranate. Meats and other protein foods: Beans. Almonds. Sunflower seeds. Pine nuts. Peanuts. Cod. Salmon. Scallops. Shrimp. Tuna. Tilapia. Clams. Oysters. Eggs. Dairy: Low-fat milk. Cheese. Greek yogurt. Beverages: Water. Red wine. Herbal tea. Fats and oils: Extra virgin olive oil. Avocado oil. Grape seed oil. Sweets and desserts: [place] yogurt with honey. Baked apples. Poached pears. Trail mix. Seasoning and other foods: Basil. Cilantro. Coriander. Cumin. Mint. Parsley. Sage. Rosemary. Tarragon. Garlic. Oregano. Thyme. Pepper. Balsalmic vinegar. Tahini. Hummus. Tomato sauce. Olives. Mushrooms. Limit these Grains: Prepackaged pasta or rice dishes. Prepackaged cereal with added sugar. Vegetables: Deep fried potatoes (french fries). Fruits: Fruit canned in syrup. Meats and other protein foods: Beef. Pork. Lamb. Poultry with skin. Hot dogs. Tomasa Blase. Dairy: Ice cream. Sour cream. Whole milk. Beverages: Juice. Sugar-sweetened soft drinks. Beer. Liquor and spirits. Fats and oils: Butter. Canola oil. Vegetable oil. Beef fat (tallow). Lard. Sweets and desserts: Cookies. Cakes. Pies. Candy. Seasoning and other foods: Mayonnaise. Premade sauces and marinades. The items listed may not be a complete list. Talk with your dietitian about what dietary choices are right for you. Summary The Mediterranean diet  includes both food and lifestyle choices. Eat a variety of fresh fruits and vegetables, beans, nuts, seeds, and whole grains. Limit the amount of red meat and sweets that you eat. Talk with your health care provider about whether it is safe for you to drink red wine in moderation. This means 1 glass a day for nonpregnant women and 2 glasses a day for men. A glass of wine equals 5 oz (150 mL). This information is not intended to replace advice  given to you by your health care provider. Make sure you discuss any questions you have with your health care provider. Document Released: 02/24/2016 Document Revised: 03/28/2016 Document Reviewed: 02/24/2016 Elsevier Interactive Patient Education  2017 [employer].

## 2023-04-05 NOTE — Telephone Encounter (Signed)
Pt's husband came back up stating the nurse was supposed to give them an email or website to go look up some information on dementia. He would like a call with that information.

## 2023-04-06 NOTE — Telephone Encounter (Signed)
Spoke with husband, he thanked me for calling. Site information given on Dementia site

## 2023-04-06 NOTE — Telephone Encounter (Signed)
Patients husband called, he wanted the name of the website, he explained that he didn't receive the voicemail about the website yesterday

## 2023-04-06 NOTE — Telephone Encounter (Signed)
I left detailed message on this site yesterday.

## 2023-04-18 ENCOUNTER — Other Ambulatory Visit: Payer: Self-pay | Admitting: Physician Assistant

## 2023-07-14 ENCOUNTER — Other Ambulatory Visit: Payer: Self-pay | Admitting: Physician Assistant

## 2023-10-03 ENCOUNTER — Encounter: Payer: Self-pay | Admitting: Physician Assistant

## 2023-10-03 ENCOUNTER — Ambulatory Visit: Payer: Medicare Other | Admitting: Physician Assistant

## 2023-10-03 VITALS — BP 122/51 | HR 78 | Resp 18 | Ht 64.0 in | Wt 146.0 lb

## 2023-10-03 DIAGNOSIS — G309 Alzheimer's disease, unspecified: Secondary | ICD-10-CM | POA: Diagnosis not present

## 2023-10-03 DIAGNOSIS — F0280 Dementia in other diseases classified elsewhere without behavioral disturbance: Secondary | ICD-10-CM | POA: Diagnosis not present

## 2023-10-03 MED ORDER — DONEPEZIL HCL 23 MG PO TABS: ORAL_TABLET | ORAL | 3 refills | Status: AC

## 2023-10-03 MED ORDER — MEMANTINE HCL 10 MG PO TABS: 10.0000 mg | ORAL_TABLET | Freq: Two times a day (BID) | ORAL | 3 refills | Status: AC

## 2023-10-03 NOTE — Patient Instructions (Signed)
 It was a pleasure to see you today at our office.   Recommendations:  Follow up in  6 months Sept 4 at 11:30  Continue donepezil   23 mg daily  Continue Memantine 10 mg twice daily.   Visit "[facility]"  Whom to call:  Memory  decline, memory medications: Call our office [phone]   For psychiatric meds, mood meds: Please have your primary care physician manage these medications.     For assessment of decision of mental capacity and competency:  Call Dr. Erick Blinks, geriatric psychiatrist at [phone]  For guidance in geriatric dementia issues please call [facility] [phone]  For guidance regarding [facility] and if placement were needed at the facility, contact Sidney Ace, Social Worker tel: [phone]  If you have any severe symptoms of a stroke, or other severe issues such as confusion,severe chills or fever, etc call 911 or go to the ER as you may need to be evaluated further   Feel free to visit Facebook page " [email or web address]" for tips of how to care for people with memory problems.      RECOMMENDATIONS FOR ALL PATIENTS WITH MEMORY PROBLEMS: 1. Continue to exercise (Recommend 30 minutes of walking everyday, or 3 hours every week) 2. Increase social interactions - continue going to [place] and enjoy social gatherings with friends and family 3. Eat healthy, avoid fried foods and eat more fruits and vegetables 4. Maintain adequate blood pressure, blood sugar, and blood cholesterol level. Reducing the risk of stroke and cardiovascular disease also helps promoting better memory. 5. Avoid stressful situations. Live a simple life and avoid aggravations. Organize your time and prepare for the next day in anticipation. 6. Sleep well, avoid any interruptions of sleep and avoid any distractions in the bedroom that may interfere with adequate sleep quality 7. Avoid sugar, avoid sweets as there is a strong link between excessive sugar  intake, diabetes, and cognitive impairment We discussed the Mediterranean diet, which has been shown to help patients reduce the risk of progressive memory disorders and reduces cardiovascular risk. This includes eating fish, eat fruits and green leafy vegetables, nuts like almonds and hazelnuts, walnuts, and also use olive oil. Avoid fast foods and fried foods as much as possible. Avoid sweets and sugar as sugar use has been linked to worsening of memory function.  There is always a concern of gradual progression of memory problems. If this is the case, then we may need to adjust level of care according to patient needs. Support, both to the patient and caregiver, should then be put into place.    The Alzheimer's Association is here all day, every day for people facing Alzheimer's disease through our free 24/7 Helpline: [phone]. The Helpline provides reliable information and support to all those who need assistance, such as individuals living with memory loss, Alzheimer's or other dementia, caregivers, health care professionals and the public.  Our highly trained and knowledgeable staff can help you with: Understanding memory loss, dementia and Alzheimer's  Medications and other treatment options  General information about aging and brain health  Skills to provide quality care and to find the best care from professionals  Legal, financial and living-arrangement decisions Our Helpline also features: Confidential care consultation provided by master's level clinicians who can help with decision-making support, crisis assistance and education on issues families face every day  Help in a caller's preferred language using our translation service that features more than 200 languages and dialects  Referrals to local community programs, services and ongoing support     FALL PRECAUTIONS: Be cautious when walking. Scan the area for obstacles that may increase the risk of trips and falls. When  getting up in the mornings, sit up at the edge of the bed for a few minutes before getting out of bed. Consider elevating the bed at the head end to avoid drop of blood pressure when getting up. Walk always in a well-lit room (use night lights in the walls). Avoid area rugs or power cords from appliances in the middle of the walkways. Use a walker or a cane if necessary and consider physical therapy for balance exercise. Get your eyesight checked regularly.  FINANCIAL OVERSIGHT: Supervision, especially oversight when making financial decisions or transactions is also recommended.  HOME SAFETY: Consider the safety of the kitchen when operating appliances like stoves, microwave oven, and blender. Consider having supervision and share cooking responsibilities until no longer able to participate in those. Accidents with firearms and other hazards in the house should be identified and addressed as well.   ABILITY TO BE LEFT ALONE: If patient is unable to contact 911 operator, consider using LifeLine, or when the need is there, arrange for someone to stay with patients. Smoking is a fire hazard, consider supervision or cessation. Risk of wandering should be assessed by caregiver and if detected at any point, supervision and safe proof recommendations should be instituted.  MEDICATION SUPERVISION: Inability to self-administer medication needs to be constantly addressed. Implement a mechanism to ensure safe administration of the medications.   DRIVING: Regarding driving, in patients with progressive memory problems, driving will be impaired. We advise to have someone else do the driving if trouble finding directions or if minor accidents are reported. Independent driving assessment is available to determine safety of driving.   If you are interested in the driving assessment, you can contact the following:  [employer] in [place] [phone]  [facility]habil[facility]  [phone]  [facility] [phone] or [phone]      Mediterranean Diet A Mediterranean diet refers to food and lifestyle choices that are based on the traditions of countries located on the [employer]. This way of eating has been shown to help prevent certain conditions and improve outcomes for people who have chronic diseases, like kidney disease and heart disease. What are tips for following this plan? Lifestyle  Cook and eat meals together with your family, when possible. Drink enough fluid to keep your urine clear or pale yellow. Be physically active every day. This includes: Aerobic exercise like running or swimming. Leisure activities like gardening, walking, or housework. Get 7-8 hours of sleep each night. If recommended by your health care provider, drink red wine in moderation. This means 1 glass a day for nonpregnant women and 2 glasses a day for men. A glass of wine equals 5 oz (150 mL). Reading food labels  Check the serving size of packaged foods. For foods such as rice and pasta, the serving size refers to the amount of cooked product, not dry. Check the total fat in packaged foods. Avoid foods that have saturated fat or trans fats. Check the ingredients list for added sugars, such as corn syrup. Shopping  At the grocery store, buy most of your food from the areas near the walls of the store. This includes: Fresh fruits and vegetables (produce). Grains, beans, nuts, and seeds. Some of these may be available in unpackaged forms  or large amounts (in bulk). Fresh seafood. Poultry and eggs. Low-fat dairy products. Buy whole ingredients instead of prepackaged foods. Buy fresh fruits and vegetables in-season from local farmers markets. Buy frozen fruits and vegetables in resealable bags. If you do not have access to quality fresh seafood, buy precooked frozen shrimp or canned fish, such as tuna, salmon, or sardines. Buy  small amounts of raw or cooked vegetables, salads, or olives from the deli or salad bar at your store. Stock your pantry so you always have certain foods on hand, such as olive oil, canned tuna, canned tomatoes, rice, pasta, and beans. Cooking  Cook foods with extra-virgin olive oil instead of using butter or other vegetable oils. Have meat as a side dish, and have vegetables or grains as your main dish. This means having meat in small portions or adding small amounts of meat to foods like pasta or stew. Use beans or vegetables instead of meat in common dishes like chili or lasagna. Experiment with different cooking methods. Try roasting or broiling vegetables instead of steaming or sauteing them. Add frozen vegetables to soups, stews, pasta, or rice. Add nuts or seeds for added healthy fat at each meal. You can add these to yogurt, salads, or vegetable dishes. Marinate fish or vegetables using olive oil, lemon juice, garlic, and fresh herbs. Meal planning  Plan to eat 1 vegetarian meal one day each week. Try to work up to 2 vegetarian meals, if possible. Eat seafood 2 or more times a week. Have healthy snacks readily available, such as: Vegetable sticks with hummus. Greek yogurt. Fruit and nut trail mix. Eat balanced meals throughout the week. This includes: Fruit: 2-3 servings a day Vegetables: 4-5 servings a day Low-fat dairy: 2 servings a day Fish, poultry, or lean meat: 1 serving a day Beans and legumes: 2 or more servings a week Nuts and seeds: 1-2 servings a day Whole grains: 6-8 servings a day Extra-virgin olive oil: 3-4 servings a day Limit red meat and sweets to only a few servings a month What are my food choices? Mediterranean diet Recommended Grains: Whole-grain pasta. Brown rice. Bulgar wheat. Polenta. Couscous. Whole-wheat bread. Orpah Cobb. Vegetables: Artichokes. Beets. Broccoli. Cabbage. Carrots. Eggplant. Green beans. Chard. Kale. Spinach. Onions. Leeks. Peas.  Squash. Tomatoes. Peppers. Radishes. Fruits: Apples. Apricots. Avocado. Berries. Bananas. Cherries. Dates. Figs. Grapes. Lemons. Melon. Oranges. Peaches. Plums. Pomegranate. Meats and other protein foods: Beans. Almonds. Sunflower seeds. Pine nuts. Peanuts. Cod. Salmon. Scallops. Shrimp. Tuna. Tilapia. Clams. Oysters. Eggs. Dairy: Low-fat milk. Cheese. Greek yogurt. Beverages: Water. Red wine. Herbal tea. Fats and oils: Extra virgin olive oil. Avocado oil. Grape seed oil. Sweets and desserts: [place] yogurt with honey. Baked apples. Poached pears. Trail mix. Seasoning and other foods: Basil. Cilantro. Coriander. Cumin. Mint. Parsley. Sage. Rosemary. Tarragon. Garlic. Oregano. Thyme. Pepper. Balsalmic vinegar. Tahini. Hummus. Tomato sauce. Olives. Mushrooms. Limit these Grains: Prepackaged pasta or rice dishes. Prepackaged cereal with added sugar. Vegetables: Deep fried potatoes (french fries). Fruits: Fruit canned in syrup. Meats and other protein foods: Beef. Pork. Lamb. Poultry with skin. Hot dogs. Tomasa Blase. Dairy: Ice cream. Sour cream. Whole milk. Beverages: Juice. Sugar-sweetened soft drinks. Beer. Liquor and spirits. Fats and oils: Butter. Canola oil. Vegetable oil. Beef fat (tallow). Lard. Sweets and desserts: Cookies. Cakes. Pies. Candy. Seasoning and other foods: Mayonnaise. Premade sauces and marinades. The items listed may not be a complete list. Talk with your dietitian about what dietary choices are right for you. Summary The Mediterranean diet includes both  food and lifestyle choices. Eat a variety of fresh fruits and vegetables, beans, nuts, seeds, and whole grains. Limit the amount of red meat and sweets that you eat. Talk with your health care provider about whether it is safe for you to drink red wine in moderation. This means 1 glass a day for nonpregnant women and 2 glasses a day for men. A glass of wine equals 5 oz (150 mL). This information is not intended to replace advice  given to you by your health care provider. Make sure you discuss any questions you have with your health care provider. Document Released: 02/24/2016 Document Revised: 03/28/2016 Document Reviewed: 02/24/2016 Elsevier Interactive Patient Education  2017 [employer].

## 2023-10-03 NOTE — Progress Notes (Signed)
 Assessment/Plan:   Dementia due to Alzheimer's disease    Courtney Rangel is a delightful 83 y.o. RH female with a history of dementia due to Alzheimer's disease.  Neurocognitive testing in September 2022 seen today in follow up for memory loss. Patient is currently on donepezil 23 mg nightly and memantine 10 mg twice daily, tolerating well.  Cognitive decline is noted, MMSE today is 12/30.  She has difficulty with both short-term and long-term memory.  She is able to participate to her ADLs.  She drives only to very familiar places.  Her mood is good    Follow up in 6  months. Continue donepezil 23 mg daily and memantine 10 mg twice daily, side effects discussed Recommend good control of her cardiovascular risk factors Continue to control mood as per PCP, she is on Lexapro per PCP Monitor driving    Subjective:    This patient is accompanied in the office by her husband who supplements the history.  Previous records as well as any outside records available were reviewed prior to todays visit. Patient was last seen on 04/05/2023 with MMSE 13/30   Any changes in memory since last visit? ".  Stays busy.  She forgets recent conversations, has difficulty finding the words.  "Short and long-term memory are affected.  She enjoys watching TV.  She enjoys spending time with her husband. Repeats oneself?  Endorsed, especially with appointments Disoriented when walking into a room?  Patient denies   Leaving objects?  May misplace things but not in unusual places  Wandering behavior?  denies   Any personality changes since last visit?  denies   Any worsening depression?:  Denies.   Hallucinations or paranoia?  Denies.   Seizures? Denies   Any sleep changes?  Sleeps well.  Denies vivid dreams, REM behavior or sleepwalking   Sleep apnea?   Denies.   Any hygiene concerns? Denies.  Independent of bathing and dressing?  Endorsed  Does the patient needs help with medications? Husband  is in charge    Who is in charge of the finances? Husband  is in charge     Any changes in appetite?  Denies. Patient have trouble swallowing? Denies.   Does the patient cook? No Any headaches?   Denies.   Chronic back pain  denies.   Ambulates with difficulty? Denies.    Recent falls or head injuries?  On November 2024, she sustained an unwitnessed fall, while walking, likely mechanical.  She is not sure if she hit her head, but all the workup including CT of the head and neck were negative.  No LOC    Unilateral weakness, numbness or tingling? Denies.   Any tremors?  Denies   Any anosmia?  Denies   Any incontinence of urine?  Denies Any bowel dysfunction?   Denies      Patient lives with her husband Does the patient drive?  Only to very familiar places   HISTORY: In 2018, she developed an episode of severe right sided facial pain radiating up the jaw to the temple lasting up to a few minutes.  No other associated focal symptoms.  Afterwards, she noticed speech difficulty.  It is not word-finding difficulty.  She knows what she wants to say but has trouble getting the words out.   She reports that it has gradually gotten worse.  She also has a dull persistent headache in her right temple.  No visual disturbance, facial droop, facial numbness. Slurred speech or  unilateral numbness or weakness.  She reports no difficulty reading or understanding other people when they speak to her.  She sometimes notes difficulty writing because her hand may shake.  She reports some short-term memory difficulty such as misplacing items but nothing significant that affects her independence and daily living.  She reports history of anxiety.   MRI of brain from 07/12/2017 demonstrated scattered periventricular and subcortical T2 hyperintensities in the cerebral white matter bilaterally, consistent with chronic small vessel ischemic changes, but no acute intracranial abnormality.  Labs, such as sed rate, CRP, CBC, CMP and TSH,  were unremarkable.   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  She has no difficulty with her job except she has to concentrate to talk with people.  She uses a Print production planner.   She reports history of migraines in young adulthood but not for many years  MRI brain personally reviewed was remarkable for stable, mild chronic small vessel ischemic changes, without acute intracranial abnormality.       Marland Kitchen   PREVIOUS MEDICATIONS:   CURRENT MEDICATIONS:  Outpatient Encounter Medications as of 10/03/2023  Medication Sig   atorvastatin (LIPITOR) 10 MG tablet Take 10 mg by mouth daily.   Cyanocobalamin (B-12 PO) Take 5,000 mg by mouth daily.   escitalopram (LEXAPRO) 10 MG tablet Take 10 mg by mouth daily.   [DISCONTINUED] donepezil (ARICEPT) 23 MG TABS tablet Take one tab daily   [DISCONTINUED] memantine (NAMENDA) 10 MG tablet TAKE 1 TABLET BY MOUTH TWICE A DAY   donepezil (ARICEPT) 23 MG TABS tablet Take one tab daily   memantine (NAMENDA) 10 MG tablet Take 1 tablet (10 mg total) by mouth 2 (two) times daily.   No facility-administered encounter medications on file as of 10/03/2023.       10/03/2023   12:00 PM 04/05/2023   12:00 PM 10/04/2022   12:00 PM  MMSE - Mini Mental State Exam  Orientation to time 0 0 2  Orientation to Place 2 1 2   Registration 3 3 3   Attention/ Calculation 0 2 0  Recall 0 0 0  Language- name 2 objects 2 2 2   Language- repeat 1 1 1   Language- follow 3 step command 3 3 3   Language- read & follow direction 1 1 1   Write a sentence 0 0 0  Copy design 0 0 0  Total score 12 13 14       04/24/2019    8:00 AM  Montreal Cognitive Assessment   Visuospatial/ Executive (0/5) 1  Naming (0/3) 1  Attention: Read list of digits (0/2) 1  Attention: Read list of letters (0/1) 1  Attention: Serial 7 subtraction starting at 100 (0/3) 0  Language: Repeat phrase (0/2) 2   Language : Fluency (0/1) 0  Abstraction (0/2) 1  Delayed Recall (0/5) 1  Orientation (0/6) 5  Total 13  Adjusted Score (based on education) 14    Objective:     PHYSICAL EXAMINATION:    VITALS:   Vitals:   10/03/23 1124  BP: (!) 122/51  Pulse: 78  Resp: 18  SpO2: 98%  Weight: 146 lb (66.2 kg)  Height: 5\' 4"  (1.626 m)    GEN:  The patient appears stated age and is in NAD. HEENT:  Normocephalic, atraumatic.   Neurological examination:  General: NAD, well-groomed, appears stated age. Orientation: The patient is alert. Oriented to person, not to  place and date Cranial nerves: There is good facial symmetry.The speech is fluent and clear. No aphasia or dysarthria. Fund of knowledge is reduced. Recent and remote memory are impaired. Attention and concentration are reduced.  Able to name objects and repeat phrases.  Hearing is intact to conversational tone.  Sensation: Sensation is intact to light touch throughout Motor: Strength is at least antigravity x4. DTR's 2/4 in UE/LE     Movement examination: Tone: There is normal tone in the UE/LE Abnormal movements:  no tremor.  No myoclonus.  No asterixis.   Coordination:  There is mild decremation with RAM's. Normal finger to nose  Gait and Station: The patient has no difficulty arising out of a deep-seated chair without the use of the hands. The patient's stride length is good.  Gait is cautious and narrow.    Thank you for allowing [place] the opportunity to participate in the care of this nice patient. Please do not hesitate to contact us for any questions or concerns.   Total time spent on today's visit was 32 minutes dedicated to this patient today, preparing to see patient, examining the patient, ordering tests and/or medications and counseling the patient, documenting clinical information in the EHR or other health record, independently interpreting results and communicating results to the patient/family, discussing treatment  and goals, answering patient's questions and coordinating care.  Cc:  Thana Ates, MD  Marlowe Kays 10/03/2023 12:31 PM

## 2023-12-25 ENCOUNTER — Telehealth: Payer: Self-pay | Admitting: Physician Assistant

## 2023-12-25 NOTE — Telephone Encounter (Signed)
 Pt husband left a message on the VM needing to speak to someone about his wife and medication and the patient has shingle. He is stating that the medication for the shingle is making dementia worse. He wants to know if there is anything that can help until she finishes the medication for the shingle

## 2023-12-25 NOTE — Telephone Encounter (Signed)
 I called patient to continue medication as prescribed per Tex Filbert, PA-C.

## 2024-01-07 ENCOUNTER — Ambulatory Visit: Admitting: Physician Assistant

## 2024-01-07 ENCOUNTER — Encounter: Payer: Self-pay | Admitting: Physician Assistant

## 2024-01-07 VITALS — BP 111/61 | HR 73 | Ht 65.0 in | Wt 148.8 lb

## 2024-01-07 DIAGNOSIS — G309 Alzheimer's disease, unspecified: Secondary | ICD-10-CM | POA: Diagnosis not present

## 2024-01-07 DIAGNOSIS — F0280 Dementia in other diseases classified elsewhere without behavioral disturbance: Secondary | ICD-10-CM

## 2024-01-07 NOTE — Progress Notes (Signed)
 Assessment/Plan:   Dementia due to Alzheimer's disease  Courtney Rangel is a very pleasant 83 y.o. RH female with a history of dementia due to Alzheimer's disease per neuropsych evaluation September 2022 seen today in follow up for memory loss. Patient is currently on donepezil  23 mg daily and memantine  10 mg twice daily, tolerating well.  Cognitive decline is noted.  She has difficulty both with short-term and long-term memory.  She is able to participate on her ADLs to her ability.   Follow up in 6  months. Continue on donepezil  23 mg daily and memantine  10 mg twice daily, side effects discussed Recommend good control of her cardiovascular risk factors Continue to control mood as per PCP      Subjective:    This patient is accompanied in the office by her husband   who supplements the history.  Previous records as well as any outside records available were reviewed prior to todays visit. Patient was last seen on 10/03/2023 with MMSE 12/30    Any changes in memory since last visit? Memory is worse-her husband says. She forgets recent conversations, has difficulty finding the words.  Short-term and long-term are affected.  She enjoys watching TV, spending time with her family. repeats oneself?  Endorsed, especially with appointments. Disoriented when walking into a room? Denies    Leaving objects?  May misplace things but not in unusual places   Wandering behavior?  denies   Any personality changes since last visit?  Denies.   Any worsening depression?:  Denies.   Hallucinations or paranoia?  Denies.   Seizures? denies    Any sleep changes?  Sleeps well.  Denies vivid dreams, REM behavior or sleepwalking   Sleep apnea?   Denies.   Any hygiene concerns? Denies.  Independent of bathing and dressing?  Endorsed  Does the patient needs help with medications?  Husband is in charge   Who is in charge of the finances?  Husband is in charge     Any changes in appetite?  Denies. Drinks  plenty fluids, favors coffee and tea    Patient have trouble swallowing? Denies.   Does the patient cook? No Any headaches?   denies .  Any vision changes?  Chronic back pain  denies. Ambulates with difficulty? Denies.    Recent falls or head injuries? Denies.     Unilateral weakness, numbness or tingling? denies   Any tremors?  Denies    Any anosmia?  Denies   Any incontinence of urine?  Denies   Any bowel dysfunction?   Denies      Patient lives with her husband  Does the patient drive? No longer drives    HISTORY: In 2018, she developed an episode of severe right sided facial pain radiating up the jaw to the temple lasting up to a few minutes.  No other associated focal symptoms.  Afterwards, she noticed speech difficulty.  It is not word-finding difficulty.  She knows what she wants to say but has trouble getting the words out.   She reports that it has gradually gotten worse.  She also has a dull persistent headache in her right temple.  No visual disturbance, facial droop, facial numbness. Slurred speech or unilateral numbness or weakness.  She reports no difficulty reading or understanding other people when they speak to her.  She sometimes notes difficulty writing because her hand may shake.  She reports some short-term memory difficulty such as misplacing items but nothing significant that  affects her independence and daily living.  She reports history of anxiety.   MRI of brain from 07/12/2017 demonstrated scattered periventricular and subcortical T2 hyperintensities in the cerebral white matter bilaterally, consistent with chronic small vessel ischemic changes, but no acute intracranial abnormality.  Labs, such as sed rate, CRP, CBC, CMP and TSH, were unremarkable.   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  She has no difficulty with her job except she has to concentrate to talk with  people.  She uses a Print production planner.   She reports history of migraines in young adulthood but not for many years   MRI brain personally reviewed was remarkable for stable, mild chronic small vessel ischemic changes, without acute intracranial abnormality.   PREVIOUS MEDICATIONS:   CURRENT MEDICATIONS:  Outpatient Encounter Medications as of 01/07/2024  Medication Sig   atorvastatin (LIPITOR) 10 MG tablet Take 10 mg by mouth daily.   donepezil  (ARICEPT ) 23 MG TABS tablet Take one tab daily   escitalopram (LEXAPRO) 10 MG tablet Take 10 mg by mouth daily.   memantine  (NAMENDA ) 10 MG tablet Take 1 tablet (10 mg total) by mouth 2 (two) times daily.   Multiple Vitamins-Minerals (WOMENS 50+ MULTI VITAMIN PO) Take by mouth daily.   Cyanocobalamin (B-12 PO) Take 5,000 mg by mouth daily. (Patient not taking: Reported on 01/07/2024)   No facility-administered encounter medications on file as of 01/07/2024.       10/03/2023   12:00 PM 04/05/2023   12:00 PM 10/04/2022   12:00 PM  MMSE - Mini Mental State Exam  Orientation to time 0 0 2  Orientation to Place 2 1 2   Registration 3 3 3   Attention/ Calculation 0 2 0  Recall 0 0 0  Language- name 2 objects 2 2 2   Language- repeat 1 1 1   Language- follow 3 step command 3 3 3   Language- read & follow direction 1 1 1   Write a sentence 0 0 0  Copy design 0 0 0  Total score 12 13 14       04/24/2019    8:00 AM  Montreal Cognitive Assessment   Visuospatial/ Executive (0/5) 1  Naming (0/3) 1  Attention: Read list of digits (0/2) 1  Attention: Read list of letters (0/1) 1  Attention: Serial 7 subtraction starting at 100 (0/3) 0  Language: Repeat phrase (0/2) 2  Language : Fluency (0/1) 0  Abstraction (0/2) 1  Delayed Recall (0/5) 1  Orientation (0/6) 5  Total 13  Adjusted Score (based on education) 14    Objective:     PHYSICAL EXAMINATION:    VITALS:   Vitals:   01/07/24 1132  BP: 111/61  Pulse: 73  SpO2: 96%  Weight: 148  lb 12.8 oz (67.5 kg)  Height: 5' 5 (1.651 m)    GEN:  The patient appears stated age and is in NAD. HEENT:  Normocephalic, atraumatic.   Neurological examination:  General: NAD, well-groomed, appears stated age. Orientation: The patient is alert. Oriented to person, not to place and date Cranial nerves: There is good facial symmetry.The speech is not fluent but clear. No aphasia or dysarthria. Fund of knowledge is reduced. Recent and remote memory are impaired. Attention and concentration are reduced. Able to name objects and repeat phrases.  Hearing is intact to conversational tone.  Sensation: Sensation is intact to light touch throughout Motor: Strength is at  least antigravity x4. DTR's 2/4 in UE/LE     Movement examination: Tone: There is normal tone in the UE/LE Abnormal movements:  no tremor.  No myoclonus.  No asterixis.   Coordination:  There is mild decremation with RAM's. Normal finger to nose  Gait and Station: The patient has no difficulty arising out of a deep-seated chair without the use of the hands. The patient's stride length is good.  Gait is cautious and narrow.    Thank you for allowing us  the opportunity to participate in the care of this nice patient. Please do not hesitate to contact us  for any questions or concerns.   Total time spent on today's visit was 39 minutes dedicated to this patient today, preparing to see patient, examining the patient, ordering tests and/or medications and counseling the patient, documenting clinical information in the EHR or other health record, independently interpreting results and communicating results to the patient/family, discussing treatment and goals, answering patient's questions and coordinating care.  Cc:  Dwight Trula SQUIBB, MD  Camie Sevin 01/07/2024 4:55 PM

## 2024-01-07 NOTE — Patient Instructions (Signed)
 It was a pleasure to see you today at our office.   Recommendations:  Follow up in  6 months   Continue donepezil    23 mg daily  Continue Memantine  10 mg twice daily.   Visit [facility]  Whom to call:  Memory  decline, memory medications: Call our office [phone]   For psychiatric meds, mood meds: Please have your primary care physician manage these medications.     For assessment of decision of mental capacity and competency:  Call Dr. Rosaline Nine, geriatric psychiatrist at [phone]  For guidance in geriatric dementia issues please call [facility] [phone]  For guidance regarding [facility] and if placement were needed at the facility, contact Nat Hock, Social Worker tel: [phone]  If you have any severe symptoms of a stroke, or other severe issues such as confusion,severe chills or fever, etc call 911 or go to the ER as you may need to be evaluated further   Feel free to visit Facebook page  [employer] for tips of how to care for people with memory problems.      RECOMMENDATIONS FOR ALL PATIENTS WITH MEMORY PROBLEMS: 1. Continue to exercise (Recommend 30 minutes of walking everyday, or 3 hours every week) 2. Increase social interactions - continue going to [place] and enjoy social gatherings with friends and family 3. Eat healthy, avoid fried foods and eat more fruits and vegetables 4. Maintain adequate blood pressure, blood sugar, and blood cholesterol level. Reducing the risk of stroke and cardiovascular disease also helps promoting better memory. 5. Avoid stressful situations. Live a simple life and avoid aggravations. Organize your time and prepare for the next day in anticipation. 6. Sleep well, avoid any interruptions of sleep and avoid any distractions in the bedroom that may interfere with adequate sleep quality 7. Avoid sugar, avoid sweets as there is a strong link between excessive sugar intake,  diabetes, and cognitive impairment We discussed the Mediterranean diet, which has been shown to help patients reduce the risk of progressive memory disorders and reduces cardiovascular risk. This includes eating fish, eat fruits and green leafy vegetables, nuts like almonds and hazelnuts, walnuts, and also use olive oil. Avoid fast foods and fried foods as much as possible. Avoid sweets and sugar as sugar use has been linked to worsening of memory function.  There is always a concern of gradual progression of memory problems. If this is the case, then we may need to adjust level of care according to patient needs. Support, both to the patient and caregiver, should then be put into place.    The Alzheimer's Association is here all day, every day for people facing Alzheimer's disease through our free 24/7 Helpline: [phone]. The Helpline provides reliable information and support to all those who need assistance, such as individuals living with memory loss, Alzheimer's or other dementia, caregivers, health care professionals and the public.  Our highly trained and knowledgeable staff can help you with: Understanding memory loss, dementia and Alzheimer's  Medications and other treatment options  General information about aging and brain health  Skills to provide quality care and to find the best care from professionals  Legal, financial and living-arrangement decisions Our Helpline also features: Confidential care consultation provided by master's level clinicians who can help with decision-making support, crisis assistance and education on issues families face every day  Help in a caller's preferred language using our translation service that features more than 200 languages and dialects  Referrals to  local community programs, services and ongoing support     FALL PRECAUTIONS: Be cautious when walking. Scan the area for obstacles that may increase the risk of trips and falls. When getting up in  the mornings, sit up at the edge of the bed for a few minutes before getting out of bed. Consider elevating the bed at the head end to avoid drop of blood pressure when getting up. Walk always in a well-lit room (use night lights in the walls). Avoid area rugs or power cords from appliances in the middle of the walkways. Use a walker or a cane if necessary and consider physical therapy for balance exercise. Get your eyesight checked regularly.  FINANCIAL OVERSIGHT: Supervision, especially oversight when making financial decisions or transactions is also recommended.  HOME SAFETY: Consider the safety of the kitchen when operating appliances like stoves, microwave oven, and blender. Consider having supervision and share cooking responsibilities until no longer able to participate in those. Accidents with firearms and other hazards in the house should be identified and addressed as well.   ABILITY TO BE LEFT ALONE: If patient is unable to contact 911 operator, consider using LifeLine, or when the need is there, arrange for someone to stay with patients. Smoking is a fire hazard, consider supervision or cessation. Risk of wandering should be assessed by caregiver and if detected at any point, supervision and safe proof recommendations should be instituted.  MEDICATION SUPERVISION: Inability to self-administer medication needs to be constantly addressed. Implement a mechanism to ensure safe administration of the medications.   DRIVING: Regarding driving, in patients with progressive memory problems, driving will be impaired. We advise to have someone else do the driving if trouble finding directions or if minor accidents are reported. Independent driving assessment is available to determine safety of driving.   If you are interested in the driving assessment, you can contact the following:  [facility] in [place] [phone]  [facility] [phone]  [facility] [phone] or [phone]      Mediterranean Diet A Mediterranean diet refers to food and lifestyle choices that are based on the traditions of countries located on the [employer]. This way of eating has been shown to help prevent certain conditions and improve outcomes for people who have chronic diseases, like kidney disease and heart disease. What are tips for following this plan? Lifestyle  Cook and eat meals together with your family, when possible. Drink enough fluid to keep your urine clear or pale yellow. Be physically active every day. This includes: Aerobic exercise like running or swimming. Leisure activities like gardening, walking, or housework. Get 7-8 hours of sleep each night. If recommended by your health care provider, drink red wine in moderation. This means 1 glass a day for nonpregnant women and 2 glasses a day for men. A glass of wine equals 5 oz (150 mL). Reading food labels  Check the serving size of packaged foods. For foods such as rice and pasta, the serving size refers to the amount of cooked product, not dry. Check the total fat in packaged foods. Avoid foods that have saturated fat or trans fats. Check the ingredients list for added sugars, such as corn syrup. Shopping  At the grocery store, buy most of your food from the areas near the walls of the store. This includes: Fresh fruits and vegetables (produce). Grains, beans, nuts, and seeds. Some of these may be available in unpackaged forms or large  amounts (in bulk). Fresh seafood. Poultry and eggs. Low-fat dairy products. Buy whole ingredients instead of prepackaged foods. Buy fresh fruits and vegetables in-season from local farmers markets. Buy frozen fruits and vegetables in resealable bags. If you do not have access to quality fresh seafood, buy precooked frozen shrimp or canned fish, such as tuna, salmon, or sardines. Buy small amounts of raw or  cooked vegetables, salads, or olives from the deli or salad bar at your store. Stock your pantry so you always have certain foods on hand, such as olive oil, canned tuna, canned tomatoes, rice, pasta, and beans. Cooking  Cook foods with extra-virgin olive oil instead of using butter or other vegetable oils. Have meat as a side dish, and have vegetables or grains as your main dish. This means having meat in small portions or adding small amounts of meat to foods like pasta or stew. Use beans or vegetables instead of meat in common dishes like chili or lasagna. Experiment with different cooking methods. Try roasting or broiling vegetables instead of steaming or sauteing them. Add frozen vegetables to soups, stews, pasta, or rice. Add nuts or seeds for added healthy fat at each meal. You can add these to yogurt, salads, or vegetable dishes. Marinate fish or vegetables using olive oil, lemon juice, garlic, and fresh herbs. Meal planning  Plan to eat 1 vegetarian meal one day each week. Try to work up to 2 vegetarian meals, if possible. Eat seafood 2 or more times a week. Have healthy snacks readily available, such as: Vegetable sticks with hummus. Greek yogurt. Fruit and nut trail mix. Eat balanced meals throughout the week. This includes: Fruit: 2-3 servings a day Vegetables: 4-5 servings a day Low-fat dairy: 2 servings a day Fish, poultry, or lean meat: 1 serving a day Beans and legumes: 2 or more servings a week Nuts and seeds: 1-2 servings a day Whole grains: 6-8 servings a day Extra-virgin olive oil: 3-4 servings a day Limit red meat and sweets to only a few servings a month What are my food choices? Mediterranean diet Recommended Grains: Whole-grain pasta. Brown rice. Bulgar wheat. Polenta. Couscous. Whole-wheat bread. [place] Made[place]. Vegetables: Artichokes. Beets. Broccoli. Cabbage. Carrots. Eggplant. Green beans. Chard. Kale. Spinach. Onions. Leeks. Peas. Squash. Tomatoes.  Peppers. Radishes. Fruits: Apples. Apricots. Avocado. Berries. Bananas. Cherries. Dates. Figs. Grapes. Lemons. Melon. Oranges. Peaches. Plums. Pomegranate. Meats and other protein foods: Beans. Almonds. Sunflower seeds. Pine nuts. Peanuts. Cod. Salmon. Scallops. Shrimp. Tuna. Tilapia. Clams. Oysters. Eggs. Dairy: Low-fat milk. Cheese. Greek yogurt. Beverages: Water. Red wine. Herbal tea. Fats and oils: Extra virgin olive oil. Avocado oil. Grape seed oil. Sweets and desserts: [place] yogurt with honey. Baked apples. Poached pears. Trail mix. Seasoning and other foods: Basil. Cilantro. Coriander. Cumin. Mint. Parsley. Sage. Rosemary. Tarragon. Garlic. Oregano. Thyme. Pepper. Balsalmic vinegar. Tahini. Hummus. Tomato sauce. Olives. Mushrooms. Limit these Grains: Prepackaged pasta or rice dishes. Prepackaged cereal with added sugar. Vegetables: Deep fried potatoes (french fries). Fruits: Fruit canned in syrup. Meats and other protein foods: Beef. Pork. Lamb. Poultry with skin. Hot dogs. [place]. Dairy: Ice cream. Sour cream. Whole milk. Beverages: Juice. Sugar-sweetened soft drinks. Beer. Liquor and spirits. Fats and oils: Butter. Canola oil. Vegetable oil. Beef fat (tallow). Lard. Sweets and desserts: Cookies. Cakes. Pies. Candy. Seasoning and other foods: Mayonnaise. Premade sauces and marinades. The items listed may not be a complete list. Talk with your dietitian about what dietary choices are right for you. Summary The Mediterranean diet includes both food and  lifestyle choices. Eat a variety of fresh fruits and vegetables, beans, nuts, seeds, and whole grains. Limit the amount of red meat and sweets that you eat. Talk with your health care provider about whether it is safe for you to drink red wine in moderation. This means 1 glass a day for nonpregnant women and 2 glasses a day for men. A glass of wine equals 5 oz (150 mL). This information is not intended to replace advice given to you by  your health care provider. Make sure you discuss any questions you have with your health care provider. Document Released: 02/24/2016 Document Revised: 03/28/2016 Document Reviewed: 02/24/2016 Elsevier Interactive Patient Education  2017 [employer].

## 2024-01-08 ENCOUNTER — Encounter: Payer: Self-pay | Admitting: Physician Assistant

## 2024-03-20 ENCOUNTER — Ambulatory Visit: Admitting: Physician Assistant

## 2024-04-08 ENCOUNTER — Encounter (HOSPITAL_COMMUNITY): Payer: Self-pay

## 2024-04-08 ENCOUNTER — Emergency Department (HOSPITAL_COMMUNITY)
Admission: EM | Admit: 2024-04-08 | Discharge: 2024-04-08 | Disposition: A | Discharge status: Home / Self Care | Attending: Emergency Medicine | Admitting: Emergency Medicine

## 2024-04-08 ENCOUNTER — Other Ambulatory Visit: Payer: Self-pay

## 2024-04-08 ENCOUNTER — Emergency Department (HOSPITAL_COMMUNITY)

## 2024-04-08 DIAGNOSIS — E860 Dehydration: Secondary | ICD-10-CM | POA: Diagnosis not present

## 2024-04-08 DIAGNOSIS — R944 Abnormal results of kidney function studies: Secondary | ICD-10-CM | POA: Diagnosis not present

## 2024-04-08 DIAGNOSIS — R55 Syncope and collapse: Secondary | ICD-10-CM | POA: Insufficient documentation

## 2024-04-08 DIAGNOSIS — G309 Alzheimer's disease, unspecified: Secondary | ICD-10-CM | POA: Insufficient documentation

## 2024-04-08 DIAGNOSIS — F0280 Dementia in other diseases classified elsewhere without behavioral disturbance: Secondary | ICD-10-CM | POA: Insufficient documentation

## 2024-04-08 DIAGNOSIS — E870 Hyperosmolality and hypernatremia: Secondary | ICD-10-CM | POA: Diagnosis not present

## 2024-04-08 LAB — COMPREHENSIVE METABOLIC PANEL WITH GFR
ALT: 17 U/L (ref 0–44)
AST: 24 U/L (ref 15–41)
Albumin: 3.5 g/dL (ref 3.5–5.0)
Alkaline Phosphatase: 89 U/L (ref 38–126)
Anion gap: 11 (ref 5–15)
BUN: 15 mg/dL (ref 8–23)
CO2: 22 mmol/L (ref 22–32)
Calcium: 7.4 mg/dL — ABNORMAL LOW (ref 8.9–10.3)
Chloride: 114 mmol/L — ABNORMAL HIGH (ref 98–111)
Creatinine, Ser: 1.2 mg/dL — ABNORMAL HIGH (ref 0.44–1.00)
GFR, Estimated: 45 mL/min — ABNORMAL LOW (ref >60)
Glucose, Bld: 87 mg/dL (ref 70–99)
Potassium: 3.2 mmol/L — ABNORMAL LOW (ref 3.5–5.1)
Sodium: 146 mmol/L — ABNORMAL HIGH (ref 135–145)
Total Bilirubin: 0.4 mg/dL (ref 0.0–1.2)
Total Protein: 5.5 g/dL — ABNORMAL LOW (ref 6.5–8.1)

## 2024-04-08 LAB — CBC WITH DIFFERENTIAL/PLATELET
Abs Immature Granulocytes: 0.06 K/uL (ref 0.00–0.07)
Basophils Absolute: 0 K/uL (ref 0.0–0.1)
Basophils Relative: 0 %
Eosinophils Absolute: 0.1 K/uL (ref 0.0–0.5)
Eosinophils Relative: 1 %
HCT: 37.6 % (ref 36.0–46.0)
Hemoglobin: 12.1 g/dL (ref 12.0–15.0)
Immature Granulocytes: 1 %
Lymphocytes Relative: 15 %
Lymphs Abs: 1.5 K/uL (ref 0.7–4.0)
MCH: 32.1 pg (ref 26.0–34.0)
MCHC: 32.2 g/dL (ref 30.0–36.0)
MCV: 99.7 fL (ref 80.0–100.0)
Monocytes Absolute: 0.3 K/uL (ref 0.1–1.0)
Monocytes Relative: 3 %
Neutro Abs: 8 K/uL — ABNORMAL HIGH (ref 1.7–7.7)
Neutrophils Relative %: 80 %
Platelets: 162 K/uL (ref 150–400)
RBC: 3.77 MIL/uL — ABNORMAL LOW (ref 3.87–5.11)
RDW: 12.5 % (ref 11.5–15.5)
WBC: 10 K/uL (ref 4.0–10.5)
nRBC: 0 % (ref 0.0–0.2)

## 2024-04-08 LAB — URINALYSIS, ROUTINE W REFLEX MICROSCOPIC
Bilirubin Urine: NEGATIVE
Glucose, UA: NEGATIVE mg/dL
Hgb urine dipstick: NEGATIVE
Ketones, ur: NEGATIVE mg/dL
Leukocytes,Ua: NEGATIVE
Nitrite: NEGATIVE
Protein, ur: NEGATIVE mg/dL
Specific Gravity, Urine: 1.01 (ref 1.005–1.030)
pH: 7 (ref 5.0–8.0)

## 2024-04-08 LAB — TROPONIN T, HIGH SENSITIVITY
Troponin T High Sensitivity: <15 ng/L (ref 0–19)
Troponin T High Sensitivity: <15 ng/L (ref 0–19)

## 2024-04-08 MED ORDER — LACTATED RINGERS IV BOLUS
500.0000 mL | Freq: Once | INTRAVENOUS | Status: AC
  Administered 2024-04-08: 500 mL via INTRAVENOUS

## 2024-04-08 NOTE — ED Notes (Signed)
 Pt ambulated and stated that she was not dizzy and felt fine.

## 2024-04-08 NOTE — Discharge Instructions (Addendum)
 You were seen today for near syncope and dehydration.  Your lab work today was very reassuring and exam is also very reassuring I have low suspicion for any emergent cause of your symptoms today.  It did appear that you were dehydrated recommend that you continue to stay hydrated with water, noting that caffeinated beverages will work against hydrating you.  Your lab work did note a low calcium recommend continued follow-up with your PCP for evaluation of this.  Return to the ED for any new or worsening symptoms.  Recommend continue to follow-up with your PCP for further monitoring.

## 2024-04-08 NOTE — ED Provider Notes (Signed)
  EMERGENCY DEPARTMENT AT [facility] Provider Note   CSN: [id number] Arrival date & time: 04/08/24  1536     Patient presents with: Loss of Consciousness   Courtney Rangel is a 83 y.o. female.  Loss of Consciousness  Patient is an 83 year old female was in the ED today for concerns for near syncope while at [employer], noted that she was leaning over to get a bundle of paper towels where she then stood up, feeling lightheaded and had to sit down, guided to the ground by her husband.  She sat on the floor for about 5 minutes where she then attempted to get up again with repeated episode of near syncope.  Noted to be diaphoretic during episode.  Since then has had abatement of symptoms.  Noted that she has had only drinking 2 glasses of tea today.  Notably is a poor historian with husband and daughter at bedside providing the majority of the story.  Medical history of Alzheimer's dementia  Denies any new medications.  Denies fever, headache, blurry vision, vertigo, tinnitus, dysphagia, unilateral weakness, numbness, tingling, chest pain, shortness of breath, abdominal pain, nausea, vomiting, diarrhea, dysuria, lower leg swelling.    Prior to Admission medications   Medication Sig Start Date End Date Taking? Authorizing Provider  atorvastatin (LIPITOR) 10 MG tablet Take 10 mg by mouth daily.    [provider]  Cyanocobalamin (B-12 PO) Take 5,000 mg by mouth daily. Patient not taking: Reported on 01/07/2024    [provider]  donepezil  (ARICEPT ) 23 MG TABS tablet Take one tab daily 10/03/23   Wertman, Sara E, PA-C  escitalopram (LEXAPRO) 10 MG tablet Take 10 mg by mouth daily. 04/04/19   [provider]  memantine  (NAMENDA ) 10 MG tablet Take 1 tablet (10 mg total) by mouth 2 (two) times daily. 10/03/23   Wertman, Sara E, PA-C  Multiple Vitamins-Minerals (WOMENS 50+ MULTI VITAMIN PO) Take by mouth daily.    [provider]     Allergies: Patient has no known allergies.    Review of Systems  Cardiovascular:  Positive for syncope.  Neurological:  Positive for syncope.  All other systems reviewed and are negative.   Updated Vital Signs BP 134/64   Pulse 69   Temp 97.6 F (36.4 C)   Resp 13   SpO2 98%   Physical Exam Vitals and nursing note reviewed.  Constitutional:      General: She is not in acute distress.    Appearance: Normal appearance. She is not ill-appearing or diaphoretic.  HENT:     Head: Normocephalic and atraumatic.  Eyes:     General: No scleral icterus.       Right eye: No discharge.        Left eye: No discharge.     Extraocular Movements: Extraocular movements intact.     Conjunctiva/sclera: Conjunctivae normal.     Pupils: Pupils are equal, round, and reactive to light.  Cardiovascular:     Rate and Rhythm: Normal rate and regular rhythm.     Pulses: Normal pulses.     Heart sounds: Normal heart sounds. No murmur heard.    No friction rub. No gallop.  Pulmonary:     Effort: Pulmonary effort is normal. No respiratory distress.     Breath sounds: No stridor. No wheezing, rhonchi or rales.  Chest:     Chest wall: No tenderness.  Abdominal:     General: Abdomen is flat. There is no distension.  Palpations: Abdomen is soft.     Tenderness: There is no abdominal tenderness. There is no right CVA tenderness, left CVA tenderness, guarding or rebound.  Musculoskeletal:        General: No swelling, deformity or signs of injury.     Cervical back: Normal range of motion. No rigidity.     Right lower leg: No edema.     Left lower leg: No edema.  Skin:    General: Skin is warm and dry.     Findings: No bruising, erythema or lesion.  Neurological:     General: No focal deficit present.     Mental Status: She is alert and oriented to person, place, and time. Mental status is at baseline.     Sensory: No sensory deficit.     Motor: No weakness.     Comments: No facial  asymmetry, no ataxia, no apraxia, no aphasia, no arm drift, normal coordination with finger-to-nose, normal sensation to both upper and lower extremities bilaterally, normal grip strength bilaterally, normal strength to both flexion and extension to both upper lower extremities 5+ bilaterally, no visual field deficits, no nystagmus.   Psychiatric:        Mood and Affect: Mood normal.     (all labs ordered are listed, but only abnormal results are displayed) Labs Reviewed  CBC WITH DIFFERENTIAL/PLATELET - Abnormal; Notable for the following components:      Result Value   RBC 3.77 (*)    Neutro Abs 8.0 (*)    All other components within normal limits  COMPREHENSIVE METABOLIC PANEL WITH GFR - Abnormal; Notable for the following components:   Sodium 146 (*)    Potassium 3.2 (*)    Chloride 114 (*)    Creatinine, Ser 1.20 (*)    Calcium 7.4 (*)    Total Protein 5.5 (*)    GFR, Estimated 45 (*)    All other components within normal limits  URINALYSIS, ROUTINE W REFLEX MICROSCOPIC  TROPONIN T, HIGH SENSITIVITY  TROPONIN T, HIGH SENSITIVITY    EKG: EKG Interpretation Date/Time:  Tuesday April 08 2024 15:45:13 EDT Ventricular Rate:  71 PR Interval:  180 QRS Duration:  95 QT Interval:  481 QTC Calculation: 523 R Axis:   19  Text Interpretation: Sinus rhythm Low voltage, precordial leads Prolonged QT interval Confirmed by Mannie Pac [phone]) on 04/08/2024 4:59:56 PM  Radiology: [facility] Chest 2 View Result Date: 04/08/2024 CLINICAL DATA:  Near syncope EXAM: CHEST - 2 VIEW COMPARISON:  None Available. FINDINGS: The heart size and mediastinal contours are within normal limits. Both lungs are clear. The visualized skeletal structures are unremarkable. IMPRESSION: No active cardiopulmonary disease. Electronically Signed   By: Greig Pique M.D.   On: 04/08/2024 17:17    Procedures   Medications Ordered in the ED  lactated ringers  bolus 500 mL (0 mLs Intravenous Stopped 04/08/24  1909)   Medical Decision Making Amount and/or Complexity of Data Reviewed Labs: ordered. Radiology: ordered. ECG/medicine tests: ordered.   This patient is a 83 year old female who presents to the ED for concern of near syncope today noting to have only had tea, 2 glasses without any other fluids noting to have bent over to pick up some total paper rolls and felt lightheaded upon standing.   On physical exam, patient is in no acute distress, afebrile, alert and orient x 1 to self at baseline, speaking in full sentences, nontachypneic, nontachycardic.  LCTAB, RRR, no murmur, no abdominal tenderness palpation, normal neuroexam.  Unremarkable otherwise.  The patient presented, will rule out cardiac etiology.  Lab work has returned showing likely dehydration as cause of patient symptoms today.  Replenish fluids and patient was ambulated and was able to ambulate without difficulty.  Will have routine follow-up with PCP for monitoring of her hypocalcemia and return to the ED for new or worsening symptoms.  Attending also saw the patient who agreed with plan.  Patient vital signs have remained stable throughout the course of patient's time in the ED. Low suspicion for any other emergent pathology at this time. I believe this patient is safe to be discharged. Provided strict return to ER precautions.  Family expressed agreement and understanding of plan. All questions were answered.  Differential diagnoses prior to evaluation: The emergent differential diagnosis includes, but is not limited to,  CVA, ACS, arrhythmia, vasovagal / orthostatic hypotension, sepsis, hypoglycemia, electrolyte disturbance, respiratory failure, anemia, dehydration, heat injury, polypharmacy, malignancy, anxiety/panic attack.  . This is not an exhaustive differential.   Past Medical History / Co-morbidities / Social History: HLD, Alzheimer's, anxiety  Additional history: Chart reviewed. Pertinent results include:    Lab  Tests/Imaging studies: I personally interpreted labs/imaging and the pertinent results include:   CBC unremarkable CMP shows likely dehydration with patient noted to have elevated sodium and elevated creatinine and decreased calcium from previous.  UA unremarkable Troponin unremarkable Chest x-ray unremarkable   I agree with the radiologist interpretation.  Cardiac monitoring: EKG obtained and interpreted by myself and attending physician which shows: Sinus rhythm   EKG Interpretation Date/Time:  Tuesday April 08 2024 15:45:13 EDT Ventricular Rate:  71 PR Interval:  180 QRS Duration:  95 QT Interval:  481 QTC Calculation: 523 R Axis:   19  Text Interpretation: Sinus rhythm Low voltage, precordial leads Prolonged QT interval Confirmed by Mannie Pac [phone]) on 04/08/2024 4:59:56 PM          Medications: I ordered medication including LR.  I have reviewed the patients home medicines and have made adjustments as needed.  Critical Interventions: None  Social Determinants of Health: None  Disposition: After consideration of the diagnostic results and the patients response to treatment, I feel that the patient would benefit from discharge as above.   emergency department workup does not suggest an emergent condition requiring admission or immediate intervention beyond what has been performed at this time. The plan is: Follow-up with PCP, hydrate. The patient is safe for discharge and has been instructed to return immediately for worsening symptoms, change in symptoms or any other concerns.  Final diagnoses:  Near syncope  Dehydration    ED Discharge Orders     None          Beola Terrall GORMAN DEVONNA 04/08/24 2124    Mannie Pac T, DO 04/09/24 2241

## 2024-04-08 NOTE — ED Notes (Signed)
 Pt drank water with no discomfort and could keep it down.

## 2024-04-08 NOTE — ED Triage Notes (Signed)
 Pt BIB EMS from [employer] due to near syncopal episode. Pt was walking tripped and caught herself on a pack of paper towels few seconds later pt fell and husband caught her, pt had near syncopal episode, but did not hit her head. Pt has not medical complaints at this time. Pt has a Hx of Dementia AAOx1 at baseline. Pt family at bedside.

## 2024-07-11 NOTE — Progress Notes (Signed)
 "     Courtney Rangel is a very pleasant 83 y.o. RH female with a history of GAD, dementia due to Alzheimer's disease.  Neuropsych evaluation September 2022 seen today in follow up for memory loss. Patient is currently on memantine  10 mg twice daily.  Of note, she continues to be on Aricept  23 mg daily, currently tolerating it well.  This patient is accompanied in the office by her husband who supplements the history.  Previous records as well as any outside records available were reviewed prior to todays visit. Patient was last seen on 01/07/2024.  Cognitive decline is noted.  She needs assistance with some ADLs, no longer drives.  Mood is stable.   Continue donepezil  23 mg daily and memantine  10 mg twice daily, side effects discussed Recommend good control cardiovascular risk factors Continue to control mood as per PCP, she is on Lexapro   Discussed the use of AI scribe software for clinical note transcription with the patient, who gave verbal consent to proceed.  History of Present Illness Courtney Rangel is an 83 year old female who presents for a follow-up on her cognitive health.  Her memory is worse according to her husband, with occasional challenges in conversations, such as forgetting what someone has said or struggling to find the right words.  She does repeat herself especially with appointments..  No issues with leaving items in places and being unable to find them. She is able to choose her clothing and bathe without assistance, while her husband manages her medications and finances.  She denies mood changes although has some moments of irritability, denies hallucinations, or sleep disturbances, without nightmares or vivid dreams. Her appetite is good, and she eats a variety of foods, although she and her husband do not cook often and eat out frequently. No trouble swallowing.  Denies any changes in vision, though she has not had her eyes checked in two to three years. She hears  well.  She experienced a syncopal episode at [employer] a couple of months ago, and another minor episode around Thanksgiving, which resolved with water intake. She has a history of not drinking enough water, preferring coffee and tea.    She denies difficulty with walking, signs of stroke, tremors, loss of smell, or incontinence. She does not drive, and her husband does not drive her either. During the day, she enjoys watching TV, particularly westerns, and playing with her dog.   HISTORY: In 2018, she developed an episode of severe right sided facial pain radiating up the jaw to the temple lasting up to a few minutes.  No other associated focal symptoms.  Afterwards, she noticed speech difficulty.  It is not word-finding difficulty.  She knows what she wants to say but has trouble getting the words out.   She reports that it has gradually gotten worse.  She also has a dull persistent headache in her right temple.  No visual disturbance, facial droop, facial numbness. Slurred speech or unilateral numbness or weakness.  She reports no difficulty reading or understanding other people when they speak to her.  She sometimes notes difficulty writing because her hand may shake.  She reports some short-term memory difficulty such as misplacing items but nothing significant that affects her independence and daily living.  She reports history of anxiety.   MRI of brain from 07/12/2017 demonstrated scattered periventricular and subcortical T2 hyperintensities in the cerebral white matter bilaterally, consistent with chronic small vessel ischemic changes, but no acute intracranial  abnormality.  Labs, such as sed rate, CRP, CBC, CMP and TSH, were unremarkable.   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  She has no difficulty with her job except she has to concentrate to talk with people.  She uses a print production planner.    She reports history of migraines in young adulthood but not for many years   MRI brain personally reviewed was remarkable for stable, mild chronic small vessel ischemic changes, without acute intracranial abnormality.      10/03/2023   12:00 PM 04/05/2023   12:00 PM 10/04/2022   12:00 PM  MMSE - Mini Mental State Exam  Orientation to time 0 0 2  Orientation to Place 2 1 2   Registration 3 3 3   Attention/ Calculation 0 2 0  Recall 0 0 0  Language- name 2 objects 2 2 2   Language- repeat 1 1 1   Language- follow 3 step command 3 3 3   Language- read & follow direction 1 1 1   Write a sentence 0 0 0  Copy design 0 0 0  Total score 12 13 14       04/24/2019    8:00 AM  Montreal Cognitive Assessment   Visuospatial/ Executive (0/5) 1  Naming (0/3) 1  Attention: Read list of digits (0/2) 1  Attention: Read list of letters (0/1) 1  Attention: Serial 7 subtraction starting at 100 (0/3) 0  Language: Repeat phrase (0/2) 2  Language : Fluency (0/1) 0  Abstraction (0/2) 1  Delayed Recall (0/5) 1  Orientation (0/6) 5  Total 13  Adjusted Score (based on education) 14      Objective:    Neurological Exam:    VITALS:   Vitals:   07/14/24 1053  BP: (!) 112/51  Pulse: 86  Resp: 20  SpO2: 98%  Weight: 149 lb (67.6 kg)  Height: 5' 5 (1.651 m)    GEN:  The patient appears stated age and is in NAD. HEENT:  Normocephalic, atraumatic.   Neurological examination:  General: NAD, well-groomed, appears stated age. Orientation: The patient is alert. Oriented to person, not to place and date Cranial nerves: There is good facial symmetry.The speech is not fluent but clear. No aphasia or dysarthria. Fund of knowledge is reduced. Recent and remote memory are impaired. Attention and concentration are reduced. Able to name objects and repeat phrases.  Hearing is intact to conversational tone. Sensation: Sensation is intact to light touch throughout Motor: Strength is at least antigravity  x4. DTR's 2/4 in UE/LE     Movement examination:  Tone: There is normal tone in the UE/LE Abnormal movements:  no tremor.  No myoclonus.  No asterixis.   Coordination:  There is decremation with RAM's. Normal finger to nose  Gait and Station: The patient has no difficulty arising out of a deep-seated chair without the use of the hands. The patient's stride length is good.  Gait is cautious and narrow.    Thank you for allowing us  the opportunity to participate in the care of this nice patient. Please do not hesitate to contact us  for any questions or concerns.   Total time spent on today's visit was 20 minutes dedicated to this patient today, preparing to see patient, examining the patient, ordering tests and/or medications and counseling the patient, documenting clinical information in the EHR or other health record, independently interpreting results and communicating results to  the patient/family, discussing treatment and goals, answering patient's questions and coordinating care.  Cc:  Dwight Trula SQUIBB, MD  Camie Sevin 07/14/2024 12:27 PM      "

## 2024-07-14 ENCOUNTER — Ambulatory Visit: Admitting: Physician Assistant

## 2024-07-14 ENCOUNTER — Encounter: Payer: Self-pay | Admitting: Physician Assistant

## 2024-07-14 VITALS — BP 112/51 | HR 86 | Resp 20 | Ht 65.0 in | Wt 149.0 lb

## 2024-07-14 DIAGNOSIS — F0280 Dementia in other diseases classified elsewhere without behavioral disturbance: Secondary | ICD-10-CM | POA: Diagnosis not present

## 2024-07-14 DIAGNOSIS — G309 Alzheimer's disease, unspecified: Secondary | ICD-10-CM | POA: Diagnosis not present

## 2024-07-14 NOTE — Patient Instructions (Signed)
 It was a pleasure to see you today at our office.   Recommendations:  Follow up in  6 months   Continue donepezil    23 mg daily  Continue Memantine  10 mg twice daily.   Visit [facility]  Whom to call:  Memory  decline, memory medications: Call our office [phone]   For psychiatric meds, mood meds: Please have your primary care physician manage these medications.     For assessment of decision of mental capacity and competency:  Call Dr. Rosaline Nine, geriatric psychiatrist at [phone]  For guidance in geriatric dementia issues please call [facility] [phone]  For guidance regarding [facility] and if placement were needed at the facility, contact Nat Hock, Social Worker tel: [phone]  If you have any severe symptoms of a stroke, or other severe issues such as confusion,severe chills or fever, etc call 911 or go to the ER as you may need to be evaluated further   Feel free to visit Facebook page  [employer] for tips of how to care for people with memory problems.      RECOMMENDATIONS FOR ALL PATIENTS WITH MEMORY PROBLEMS: 1. Continue to exercise (Recommend 30 minutes of walking everyday, or 3 hours every week) 2. Increase social interactions - continue going to [place] and enjoy social gatherings with friends and family 3. Eat healthy, avoid fried foods and eat more fruits and vegetables 4. Maintain adequate blood pressure, blood sugar, and blood cholesterol level. Reducing the risk of stroke and cardiovascular disease also helps promoting better memory. 5. Avoid stressful situations. Live a simple life and avoid aggravations. Organize your time and prepare for the next day in anticipation. 6. Sleep well, avoid any interruptions of sleep and avoid any distractions in the bedroom that may interfere with adequate sleep quality 7. Avoid sugar, avoid sweets as there is a strong link between excessive sugar intake,  diabetes, and cognitive impairment We discussed the Mediterranean diet, which has been shown to help patients reduce the risk of progressive memory disorders and reduces cardiovascular risk. This includes eating fish, eat fruits and green leafy vegetables, nuts like almonds and hazelnuts, walnuts, and also use olive oil. Avoid fast foods and fried foods as much as possible. Avoid sweets and sugar as sugar use has been linked to worsening of memory function.  There is always a concern of gradual progression of memory problems. If this is the case, then we may need to adjust level of care according to patient needs. Support, both to the patient and caregiver, should then be put into place.    The Alzheimer's Association is here all day, every day for people facing Alzheimer's disease through our free 24/7 Helpline: [phone]. The Helpline provides reliable information and support to all those who need assistance, such as individuals living with memory loss, Alzheimer's or other dementia, caregivers, health care professionals and the public.  Our highly trained and knowledgeable staff can help you with: Understanding memory loss, dementia and Alzheimer's  Medications and other treatment options  General information about aging and brain health  Skills to provide quality care and to find the best care from professionals  Legal, financial and living-arrangement decisions Our Helpline also features: Confidential care consultation provided by master's level clinicians who can help with decision-making support, crisis assistance and education on issues families face every day  Help in a caller's preferred language using our translation service that features more than 200 languages and dialects  Referrals to  local community programs, services and ongoing support     FALL PRECAUTIONS: Be cautious when walking. Scan the area for obstacles that may increase the risk of trips and falls. When getting up in  the mornings, sit up at the edge of the bed for a few minutes before getting out of bed. Consider elevating the bed at the head end to avoid drop of blood pressure when getting up. Walk always in a well-lit room (use night lights in the walls). Avoid area rugs or power cords from appliances in the middle of the walkways. Use a walker or a cane if necessary and consider physical therapy for balance exercise. Get your eyesight checked regularly.  FINANCIAL OVERSIGHT: Supervision, especially oversight when making financial decisions or transactions is also recommended.  HOME SAFETY: Consider the safety of the kitchen when operating appliances like stoves, microwave oven, and blender. Consider having supervision and share cooking responsibilities until no longer able to participate in those. Accidents with firearms and other hazards in the house should be identified and addressed as well.   ABILITY TO BE LEFT ALONE: If patient is unable to contact 911 operator, consider using LifeLine, or when the need is there, arrange for someone to stay with patients. Smoking is a fire hazard, consider supervision or cessation. Risk of wandering should be assessed by caregiver and if detected at any point, supervision and safe proof recommendations should be instituted.  MEDICATION SUPERVISION: Inability to self-administer medication needs to be constantly addressed. Implement a mechanism to ensure safe administration of the medications.   DRIVING: Regarding driving, in patients with progressive memory problems, driving will be impaired. We advise to have someone else do the driving if trouble finding directions or if minor accidents are reported. Independent driving assessment is available to determine safety of driving.   If you are interested in the driving assessment, you can contact the following:  [facility] in [place] [phone]  [facility] [phone]  [facility] [phone] or [phone]      Mediterranean Diet A Mediterranean diet refers to food and lifestyle choices that are based on the traditions of countries located on the [employer]. This way of eating has been shown to help prevent certain conditions and improve outcomes for people who have chronic diseases, like kidney disease and heart disease. What are tips for following this plan? Lifestyle  Cook and eat meals together with your family, when possible. Drink enough fluid to keep your urine clear or pale yellow. Be physically active every day. This includes: Aerobic exercise like running or swimming. Leisure activities like gardening, walking, or housework. Get 7-8 hours of sleep each night. If recommended by your health care provider, drink red wine in moderation. This means 1 glass a day for nonpregnant women and 2 glasses a day for men. A glass of wine equals 5 oz (150 mL). Reading food labels  Check the serving size of packaged foods. For foods such as rice and pasta, the serving size refers to the amount of cooked product, not dry. Check the total fat in packaged foods. Avoid foods that have saturated fat or trans fats. Check the ingredients list for added sugars, such as corn syrup. Shopping  At the grocery store, buy most of your food from the areas near the walls of the store. This includes: Fresh fruits and vegetables (produce). Grains, beans, nuts, and seeds. Some of these may be available in unpackaged forms or large  amounts (in bulk). Fresh seafood. Poultry and eggs. Low-fat dairy products. Buy whole ingredients instead of prepackaged foods. Buy fresh fruits and vegetables in-season from local farmers markets. Buy frozen fruits and vegetables in resealable bags. If you do not have access to quality fresh seafood, buy precooked frozen shrimp or canned fish, such as tuna, salmon, or sardines. Buy small amounts of raw or  cooked vegetables, salads, or olives from the deli or salad bar at your store. Stock your pantry so you always have certain foods on hand, such as olive oil, canned tuna, canned tomatoes, rice, pasta, and beans. Cooking  Cook foods with extra-virgin olive oil instead of using butter or other vegetable oils. Have meat as a side dish, and have vegetables or grains as your main dish. This means having meat in small portions or adding small amounts of meat to foods like pasta or stew. Use beans or vegetables instead of meat in common dishes like chili or lasagna. Experiment with different cooking methods. Try roasting or broiling vegetables instead of steaming or sauteing them. Add frozen vegetables to soups, stews, pasta, or rice. Add nuts or seeds for added healthy fat at each meal. You can add these to yogurt, salads, or vegetable dishes. Marinate fish or vegetables using olive oil, lemon juice, garlic, and fresh herbs. Meal planning  Plan to eat 1 vegetarian meal one day each week. Try to work up to 2 vegetarian meals, if possible. Eat seafood 2 or more times a week. Have healthy snacks readily available, such as: Vegetable sticks with hummus. Greek yogurt. Fruit and nut trail mix. Eat balanced meals throughout the week. This includes: Fruit: 2-3 servings a day Vegetables: 4-5 servings a day Low-fat dairy: 2 servings a day Fish, poultry, or lean meat: 1 serving a day Beans and legumes: 2 or more servings a week Nuts and seeds: 1-2 servings a day Whole grains: 6-8 servings a day Extra-virgin olive oil: 3-4 servings a day Limit red meat and sweets to only a few servings a month What are my food choices? Mediterranean diet Recommended Grains: Whole-grain pasta. Brown rice. Bulgar wheat. Polenta. Couscous. Whole-wheat bread. [place] Made[place]. Vegetables: Artichokes. Beets. Broccoli. Cabbage. Carrots. Eggplant. Green beans. Chard. Kale. Spinach. Onions. Leeks. Peas. Squash. Tomatoes.  Peppers. Radishes. Fruits: Apples. Apricots. Avocado. Berries. Bananas. Cherries. Dates. Figs. Grapes. Lemons. Melon. Oranges. Peaches. Plums. Pomegranate. Meats and other protein foods: Beans. Almonds. Sunflower seeds. Pine nuts. Peanuts. Cod. Salmon. Scallops. Shrimp. Tuna. Tilapia. Clams. Oysters. Eggs. Dairy: Low-fat milk. Cheese. Greek yogurt. Beverages: Water. Red wine. Herbal tea. Fats and oils: Extra virgin olive oil. Avocado oil. Grape seed oil. Sweets and desserts: [place] yogurt with honey. Baked apples. Poached pears. Trail mix. Seasoning and other foods: Basil. Cilantro. Coriander. Cumin. Mint. Parsley. Sage. Rosemary. Tarragon. Garlic. Oregano. Thyme. Pepper. Balsalmic vinegar. Tahini. Hummus. Tomato sauce. Olives. Mushrooms. Limit these Grains: Prepackaged pasta or rice dishes. Prepackaged cereal with added sugar. Vegetables: Deep fried potatoes (french fries). Fruits: Fruit canned in syrup. Meats and other protein foods: Beef. Pork. Lamb. Poultry with skin. Hot dogs. [place]. Dairy: Ice cream. Sour cream. Whole milk. Beverages: Juice. Sugar-sweetened soft drinks. Beer. Liquor and spirits. Fats and oils: Butter. Canola oil. Vegetable oil. Beef fat (tallow). Lard. Sweets and desserts: Cookies. Cakes. Pies. Candy. Seasoning and other foods: Mayonnaise. Premade sauces and marinades. The items listed may not be a complete list. Talk with your dietitian about what dietary choices are right for you. Summary The Mediterranean diet includes both food and  lifestyle choices. Eat a variety of fresh fruits and vegetables, beans, nuts, seeds, and whole grains. Limit the amount of red meat and sweets that you eat. Talk with your health care provider about whether it is safe for you to drink red wine in moderation. This means 1 glass a day for nonpregnant women and 2 glasses a day for men. A glass of wine equals 5 oz (150 mL). This information is not intended to replace advice given to you by  your health care provider. Make sure you discuss any questions you have with your health care provider. Document Released: 02/24/2016 Document Revised: 03/28/2016 Document Reviewed: 02/24/2016 Elsevier Interactive Patient Education  2017 [employer].

## 2025-01-12 ENCOUNTER — Ambulatory Visit: Payer: Self-pay | Admitting: Physician Assistant
